# Patient Record
Sex: Female | Born: 1955 | Race: White | Hispanic: No | Marital: Married | Smoking: Never smoker
Health system: Southern US, Community
[De-identification: ages and names within clinical notes are randomized; demographics above are authoritative.]

## PROBLEM LIST (undated history)

## (undated) DIAGNOSIS — B009 Herpesviral infection, unspecified: Secondary | ICD-10-CM

## (undated) DIAGNOSIS — E079 Disorder of thyroid, unspecified: Secondary | ICD-10-CM

## (undated) DIAGNOSIS — C55 Malignant neoplasm of uterus, part unspecified: Secondary | ICD-10-CM

## (undated) DIAGNOSIS — E785 Hyperlipidemia, unspecified: Secondary | ICD-10-CM

## (undated) HISTORY — PX: ABDOMINAL HYSTERECTOMY: SHX81

## (undated) HISTORY — DX: Malignant neoplasm of uterus, part unspecified: C55

## (undated) HISTORY — DX: Herpesviral infection, unspecified: B00.9

## (undated) HISTORY — PX: TONSILLECTOMY AND ADENOIDECTOMY: SUR1326

## (undated) HISTORY — PX: TUBAL LIGATION: SHX77

## (undated) HISTORY — DX: Disorder of thyroid, unspecified: E07.9

## (undated) HISTORY — DX: Hyperlipidemia, unspecified: E78.5

---

## 2011-03-01 ENCOUNTER — Other Ambulatory Visit: Payer: Self-pay | Admitting: Family Medicine

## 2011-03-01 ENCOUNTER — Ambulatory Visit
Admission: RE | Admit: 2011-03-01 | Discharge: 2011-03-01 | Disposition: A | Discharge status: Home / Self Care | Payer: BLUE CROSS/BLUE SHIELD | Source: Ambulatory Visit | Attending: Family Medicine | Admitting: Family Medicine

## 2011-03-01 DIAGNOSIS — Z1231 Encounter for screening mammogram for malignant neoplasm of breast: Secondary | ICD-10-CM

## 2012-01-24 ENCOUNTER — Other Ambulatory Visit: Payer: Self-pay | Admitting: Family Medicine

## 2012-01-24 DIAGNOSIS — Z1231 Encounter for screening mammogram for malignant neoplasm of breast: Secondary | ICD-10-CM

## 2012-03-01 LAB — HM COLONOSCOPY

## 2012-03-06 ENCOUNTER — Ambulatory Visit
Admission: RE | Admit: 2012-03-06 | Discharge: 2012-03-06 | Disposition: A | Discharge status: Home / Self Care | Payer: BLUE CROSS/BLUE SHIELD | Source: Ambulatory Visit | Attending: Family Medicine | Admitting: Family Medicine

## 2012-03-06 DIAGNOSIS — Z1231 Encounter for screening mammogram for malignant neoplasm of breast: Secondary | ICD-10-CM

## 2012-08-02 ENCOUNTER — Ambulatory Visit (INDEPENDENT_AMBULATORY_CARE_PROVIDER_SITE_OTHER): Payer: BLUE CROSS/BLUE SHIELD | Admitting: Sports Medicine

## 2012-08-02 ENCOUNTER — Encounter: Payer: Self-pay | Admitting: Sports Medicine

## 2012-08-02 VITALS — BP 134/71 | HR 74 | Temp 97.0°F | Ht 61.5 in | Wt 150.0 lb

## 2012-08-02 DIAGNOSIS — Z23 Encounter for immunization: Secondary | ICD-10-CM

## 2012-08-02 DIAGNOSIS — H1010 Acute atopic conjunctivitis, unspecified eye: Secondary | ICD-10-CM | POA: Insufficient documentation

## 2012-08-02 DIAGNOSIS — E785 Hyperlipidemia, unspecified: Secondary | ICD-10-CM

## 2012-08-02 DIAGNOSIS — I10 Essential (primary) hypertension: Secondary | ICD-10-CM | POA: Insufficient documentation

## 2012-08-02 DIAGNOSIS — E079 Disorder of thyroid, unspecified: Secondary | ICD-10-CM

## 2012-08-02 DIAGNOSIS — B009 Herpesviral infection, unspecified: Secondary | ICD-10-CM | POA: Insufficient documentation

## 2012-08-02 DIAGNOSIS — H1045 Other chronic allergic conjunctivitis: Secondary | ICD-10-CM

## 2012-08-02 DIAGNOSIS — E039 Hypothyroidism, unspecified: Secondary | ICD-10-CM | POA: Insufficient documentation

## 2012-08-02 DIAGNOSIS — D1800 Hemangioma unspecified site: Secondary | ICD-10-CM | POA: Insufficient documentation

## 2012-08-02 DIAGNOSIS — Z299 Encounter for prophylactic measures, unspecified: Secondary | ICD-10-CM | POA: Insufficient documentation

## 2012-08-02 DIAGNOSIS — K644 Residual hemorrhoidal skin tags: Secondary | ICD-10-CM

## 2012-08-02 MED ORDER — WITCH HAZEL EX PADS: MEDICATED_PAD | CUTANEOUS | Status: DC

## 2012-08-02 MED ORDER — HYDROCORTISONE ACETATE 25 MG RE SUPP: 25.0000 mg | Freq: Two times a day (BID) | RECTAL | Status: AC | PRN

## 2012-08-02 MED ORDER — DOCUSATE SODIUM 100 MG PO CAPS: 100.0000 mg | ORAL_CAPSULE | Freq: Two times a day (BID) | ORAL | Status: AC

## 2012-08-02 MED ORDER — OLOPATADINE HCL 0.1 % OP SOLN: 1.0000 [drp] | Freq: Two times a day (BID) | OPHTHALMIC | Status: DC

## 2012-08-02 NOTE — Assessment & Plan Note (Signed)
Well controlled. Checking CMET. 

## 2012-08-02 NOTE — Assessment & Plan Note (Signed)
It is recommended she continue her fexofenadine, as well as start Patanol eyedrops. She will come back to see me on an as-needed basis for this.

## 2012-08-02 NOTE — Progress Notes (Signed)
Patient ID: Debra Brown, female   DOB: 1956-10-28, 56 y.o.   MRN: [id number] Subjective:    CC: Establish care.   HPI:  Hemorrhoids:  Present for several months, itchy, and moderately painful. She does note that she tends to strain and stooling. She notes occasional blood on the toilet paper, but not in the bowl.  Itchy eyes: present typically this time of year, currently using fexofenadine 180 mg which is ineffective. No visual changes.  Hypothyroidism:  Has recently had her dose cut in half from 150 to 75 mcg approximately 3 months ago.  Hypertension:  Well-controlled currently with lisinopril/hydrochlorothiazide.  Skin lesion:  Present for many years, located on the inner right thigh, tends to bleed profusely when cut during shaving.  Preventive care:  Colonoscopy in May of 2013, negative, mammograms are done here, and up-to-date, has not yet had tetanus shot.  Past medical history, Surgical history, Family history, Social history, Allergies, and medications have been entered into the medical record, reviewed, and no changes needed.   Review of Systems: No headache, visual changes, nausea, vomiting, diarrhea, constipation, dizziness, abdominal pain, skin rash, fevers, chills, night sweats, weight loss, chest pain, body aches, joint swelling, muscle aches, or shortness of breath.   Objective:    General: Well Developed, well nourished, and in no acute distress.  Neuro: Alert and oriented x3, extra-ocular muscles intact.  HEENT: Normocephalic, atraumatic, pupils equal round reactive to light, neck supple, no masses, no lymphadenopathy, thyroid nonpalpable.  Skin: small, 0.5 cm hemangioma present on the medial right thigh..  Cardiac: Regular rate and rhythm, no murmurs rubs or gallops.  Respiratory: Clear to auscultation bilaterally. Not using accessory muscles, speaking in full sentences.  Abdominal: Soft, nontender, nondistended, positive bowel sounds, no masses, no organomegaly.    Musculoskeletal: Shoulder, elbow, wrist, hip, knee, ankle stable, and with full range of motion.    Impression and Recommendations:

## 2012-08-02 NOTE — Assessment & Plan Note (Signed)
I will excise this at her next visit.

## 2012-08-02 NOTE — Assessment & Plan Note (Signed)
Anusol suppositories, witch hazel pads, Colace. She can come back to see me on an as-needed basis for this, if acute flair develops, this can be surgically excised in the office if caught within the first 48 hours.

## 2012-08-02 NOTE — Assessment & Plan Note (Signed)
With her recent dose adjustment, we will go ahead and recheck her TSH. He does note some temperature change intolerance, and is curious as to whether this is due to perimenopausal syndrome versus thyroid disease. We will correct her thyroid disease, and if this persists, I can treat her for a vasomotor instability. She did desire to try more natural method first, I recommended black cohosh, I did explain the limitations in studied effectiveness.

## 2012-08-06 LAB — LIPID PANEL
Cholesterol: 189 mg/dL (ref 0–200)
HDL: 60 mg/dL (ref >39)
LDL Cholesterol: 114 mg/dL — ABNORMAL HIGH (ref 0–99)
Total CHOL/HDL Ratio: 3.2 ratio
Triglycerides: 75 mg/dL (ref <150)
VLDL: 15 mg/dL (ref 0–40)

## 2012-08-06 LAB — COMPREHENSIVE METABOLIC PANEL WITH GFR
AST: 16 U/L (ref 0–37)
BUN: 16 mg/dL (ref 6–23)
CO2: 28 meq/L (ref 19–32)
Calcium: 9.4 mg/dL (ref 8.4–10.5)
Chloride: 103 meq/L (ref 96–112)
Creat: 0.82 mg/dL (ref 0.50–1.10)
Glucose, Bld: 84 mg/dL (ref 70–99)

## 2012-08-06 LAB — TSH: TSH: 7.09 u[IU]/mL — ABNORMAL HIGH (ref 0.350–4.500)

## 2012-08-06 LAB — COMPREHENSIVE METABOLIC PANEL
ALT: 13 U/L (ref 0–35)
Albumin: 4.1 g/dL (ref 3.5–5.2)
Alkaline Phosphatase: 73 U/L (ref 39–117)
Potassium: 3.9 mEq/L (ref 3.5–5.3)
Sodium: 140 mEq/L (ref 135–145)
Total Bilirubin: 0.4 mg/dL (ref 0.3–1.2)
Total Protein: 7.2 g/dL (ref 6.0–8.3)

## 2012-08-07 ENCOUNTER — Encounter: Payer: Self-pay | Admitting: Sports Medicine

## 2012-08-07 ENCOUNTER — Telehealth: Payer: Self-pay | Admitting: *Deleted

## 2012-08-07 MED ORDER — LEVOTHYROXINE SODIUM 88 MCG PO TABS: 88.0000 ug | ORAL_TABLET | Freq: Every day | ORAL | Status: DC

## 2012-08-07 NOTE — Telephone Encounter (Signed)
Message copied by Renea Ee on Tue Aug 07, 2012  9:16 AM ------      Message from: Monica Becton      Created: Tue Aug 07, 2012  9:07 AM       Please call and have her switch to the 88 mcg levothyroxine.  Her TSH was still too high (undertreated, she was taking )

## 2012-08-07 NOTE — Addendum Note (Signed)
Addended by: Monica Becton on: 08/07/2012 09:05 AM   Modules accepted: Orders

## 2012-08-07 NOTE — Telephone Encounter (Signed)
Pt informed

## 2012-08-09 ENCOUNTER — Ambulatory Visit (INDEPENDENT_AMBULATORY_CARE_PROVIDER_SITE_OTHER): Payer: BLUE CROSS/BLUE SHIELD | Admitting: Sports Medicine

## 2012-08-09 ENCOUNTER — Encounter: Payer: Self-pay | Admitting: Sports Medicine

## 2012-08-09 ENCOUNTER — Encounter: Payer: Self-pay | Admitting: *Deleted

## 2012-08-09 VITALS — BP 154/78 | HR 60 | Wt 152.0 lb

## 2012-08-09 DIAGNOSIS — D1800 Hemangioma unspecified site: Secondary | ICD-10-CM

## 2012-08-09 NOTE — Assessment & Plan Note (Signed)
Excision performed and sent off for pathology. She'll come back to see me in an as needed basis. Aftercare advised.

## 2012-08-09 NOTE — Progress Notes (Signed)
Subjective:    CC: Removal of right thigh hemangioma  HPI: Debra Brown comes in for excision of a lesion on her right inner thigh that appeared to be hemangioma at previous visits. It bleeds profusely ever she shaves her thighs. She desires to have this removed. It is been there for years, has been fairly stable in terms of size.  Past medical history, Surgical history, Family history, Social history, Allergies, and medications have been entered into the medical record, reviewed, and no changes needed.   Review of Systems: No fevers, chills, night sweats, weight loss, chest pain, or shortness of breath.   Objective:    General: Well Developed, well nourished, and in no acute distress.   Procedure:  Excision of 1 cm right inner thigh hemangioma  Risks, benefits, and alternatives explained and consent obtained. Time out conducted. Surface prepped with alcohol. 5cc lidocaine with epinephine infiltrated in a field block. Adequate anesthesia ensured. Area prepped and draped in a sterile fashion. Excision performed with: 4 mm punch biopsy. Specimen sent off to pathology. 5-0 Vicryl used in the subcuticular suture to approximate the edges of the wound. Steri-Strips placed on top. Hemostasis achieved. Pt stable. Impression and Recommendations:

## 2019-02-28 DIAGNOSIS — C541 Malignant neoplasm of endometrium: Secondary | ICD-10-CM | POA: Insufficient documentation

## 2019-03-10 DIAGNOSIS — I447 Left bundle-branch block, unspecified: Secondary | ICD-10-CM | POA: Insufficient documentation

## 2019-12-30 ENCOUNTER — Telehealth: Payer: Self-pay | Admitting: General Practice

## 2019-12-30 NOTE — Telephone Encounter (Signed)
That is fine 

## 2019-12-30 NOTE — Telephone Encounter (Signed)
Patient wanted to establish with you, daughter, Sheralyn Boatman, is your patient. Please advise.

## 2020-01-01 NOTE — Telephone Encounter (Signed)
Left voicemail for patient to call us back for an appointment and with information below.

## 2020-01-08 ENCOUNTER — Encounter: Payer: Self-pay | Admitting: Physician Assistant

## 2020-01-08 ENCOUNTER — Ambulatory Visit (INDEPENDENT_AMBULATORY_CARE_PROVIDER_SITE_OTHER): Payer: Medicare HMO | Admitting: Physician Assistant

## 2020-01-08 ENCOUNTER — Other Ambulatory Visit: Payer: Self-pay

## 2020-01-08 VITALS — BP 130/84 | HR 66 | Ht 61.5 in | Wt 147.0 lb

## 2020-01-08 DIAGNOSIS — Z780 Asymptomatic menopausal state: Secondary | ICD-10-CM | POA: Diagnosis not present

## 2020-01-08 DIAGNOSIS — I10 Essential (primary) hypertension: Secondary | ICD-10-CM | POA: Diagnosis not present

## 2020-01-08 DIAGNOSIS — Z8639 Personal history of other endocrine, nutritional and metabolic disease: Secondary | ICD-10-CM

## 2020-01-08 DIAGNOSIS — E890 Postprocedural hypothyroidism: Secondary | ICD-10-CM

## 2020-01-08 DIAGNOSIS — Z13820 Encounter for screening for osteoporosis: Secondary | ICD-10-CM

## 2020-01-08 DIAGNOSIS — Z8542 Personal history of malignant neoplasm of other parts of uterus: Secondary | ICD-10-CM

## 2020-01-08 DIAGNOSIS — Z1159 Encounter for screening for other viral diseases: Secondary | ICD-10-CM

## 2020-01-08 DIAGNOSIS — Z23 Encounter for immunization: Secondary | ICD-10-CM

## 2020-01-08 DIAGNOSIS — Z13220 Encounter for screening for lipoid disorders: Secondary | ICD-10-CM

## 2020-01-08 DIAGNOSIS — Z131 Encounter for screening for diabetes mellitus: Secondary | ICD-10-CM

## 2020-01-08 MED ORDER — LISINOPRIL-HYDROCHLOROTHIAZIDE 20-25 MG PO TABS: 1.0000 | ORAL_TABLET | Freq: Every day | ORAL | 1 refills | Status: DC

## 2020-01-08 MED ORDER — LEVOTHYROXINE SODIUM 125 MCG PO TABS: 125.0000 ug | ORAL_TABLET | Freq: Every day | ORAL | 0 refills | Status: DC

## 2020-01-08 MED ORDER — AMBULATORY NON FORMULARY MEDICATION: 0 refills | Status: DC

## 2020-01-08 NOTE — Patient Instructions (Addendum)
..  COVID-19 Vaccine Information can be found at: [email or web address].uk For questions related to vaccine distribution or appointments, please email [email or web address] or call [phone].   Get labs today.  Get Covid vaccine.  GET shingles vaccine.  Follow up in 6 month on BP.

## 2020-01-08 NOTE — Progress Notes (Signed)
New Patient Office Visit  Subjective:  Patient ID: Debra Brown, female    DOB: 06-Jan-1956  Age: 64 y.o. MRN: [id number]  CC:  Chief Complaint  Patient presents with  . Establish Care    HPI Debra Brown presents to establish care.   No ongoing concerns today. Just wants to "stay up to date on her health".   She is scheduled for endocrinologist on march 1st. Her TSH continues to be elevated despite being on medication. Per pt last TSH was above 10. No adjustment was made to medication. Only referral made.    Past Medical History:  Diagnosis Date  . HSV-1 (herpes simplex virus 1) infection   . Hyperlipidemia   . Thyroid disease   . Uterine cancer [facility])     Past Surgical History:  Procedure Laterality Date  . ABDOMINAL HYSTERECTOMY    . CESAREAN SECTION    . TONSILLECTOMY AND ADENOIDECTOMY    . TUBAL LIGATION      Family History  Problem Relation Age of Onset  . Hyperlipidemia Mother   . Hypertension Mother   . Heart attack Mother   . Diabetes Mother   . Heart attack Father   . Heart attack Sister   . Diabetes Sister   . Heart attack Sister   . Diabetes Sister   . CAD Sister   . Stroke Paternal Aunt     Social History   Socioeconomic History  . Marital status: Married    Spouse name: Not on file  . Number of children: Not on file  . Years of education: Not on file  . Highest education level: Not on file  Occupational History  . Not on file  Tobacco Use  . Smoking status: Never Smoker  . Smokeless tobacco: Never Used  Substance and Sexual Activity  . Alcohol use: Yes    Alcohol/week: 2.0 standard drinks    Types: 2 Glasses of wine per week  . Drug use: No  . Sexual activity: Yes    Partners: Male  Other Topics Concern  . Not on file  Social History Narrative  . Not on file   Social Determinants of Health   Financial Resource Strain:   . Difficulty of Paying Living Expenses: Not on file  Food Insecurity:   . Worried About Sales executive in the Last Year: Not on file  . Ran Out of Food in the Last Year: Not on file  Transportation Needs:   . Lack of Transportation (Medical): Not on file  . Lack of Transportation (Non-Medical): Not on file  Physical Activity:   . Days of Exercise per Week: Not on file  . Minutes of Exercise per Session: Not on file  Stress:   . Feeling of Stress : Not on file  Social Connections:   . Frequency of Communication with Friends and Family: Not on file  . Frequency of Social Gatherings with Friends and Family: Not on file  . Attends Religious Services: Not on file  . Active Member of Clubs or Organizations: Not on file  . Attends Archivist Meetings: Not on file  . Marital Status: Not on file  Intimate Partner Violence:   . Fear of Current or Ex-Partner: Not on file  . Emotionally Abused: Not on file  . Physically Abused: Not on file  . Sexually Abused: Not on file    ROS Review of Systems  All other systems reviewed and are negative.   Objective:  Today's Vitals: BP 130/84   Pulse 66   Ht 5' 1.5" (1.562 m)   Wt 147 lb (66.7 kg)   SpO2 97%   BMI 27.33 kg/m   Physical Exam Vitals reviewed.  Constitutional:      Appearance: Normal appearance.  HENT:     Head: Normocephalic.  Neck:     Vascular: No carotid bruit.  Cardiovascular:     Rate and Rhythm: Normal rate and regular rhythm.     Pulses: Normal pulses.     Heart sounds: No murmur.  Pulmonary:     Effort: Pulmonary effort is normal.     Breath sounds: Normal breath sounds.  Lymphadenopathy:     Cervical: No cervical adenopathy.  Neurological:     General: No focal deficit present.     Mental Status: She is alert and oriented to person, place, and time.  Psychiatric:        Mood and Affect: Mood normal.        Behavior: Behavior normal.     Assessment & Plan:  .Marland KitchenJase was seen today for establish care.  Diagnoses and all orders for this visit:  Essential hypertension -      lisinopril-hydrochlorothiazide (ZESTORETIC) 20-25 MG tablet; Take 1 tablet by mouth daily.  Postablative hypothyroidism -     levothyroxine (SYNTHROID) 125 MCG tablet; Take 1 tablet (125 mcg total) by mouth daily.  Osteoporosis screening -     DG Bone Density -     VITAMIN D 25 Hydroxy (Vit-D Deficiency, Fractures)  Post-menopausal -     DG Bone Density -     VITAMIN D 25 Hydroxy (Vit-D Deficiency, Fractures)  Screening for lipid disorders -     Lipid Panel w/reflex Direct LDL  Screening for diabetes mellitus -     COMPLETE METABOLIC PANEL WITH GFR  Encounter for hepatitis C screening test for low risk patient -     Hepatitis C Antibody  History of uterine cancer  History of Graves' disease  Need for shingles vaccine -     AMBULATORY NON FORMULARY MEDICATION; shingrx 2 doses to prevent shingles.  reviewed labs in careeverywhere.  TSH on 1/25 was 10.4. went ahead and bumped up levothyroxine. Keep follow up with endocrinology.   Refilled BP medication. BP to goal today.   Pt does need shingles vaccine. Printed rx. Ordered bone density. Will abstract mammogram from [facility] records. Fasting labs ordered.   Discussed covid vaccine. Information given. No other vaccines within 14 days of covid vaccine.    Follow-up: Return in about 6 months (around 07/07/2020) for BP follow up. Iran Planas, PA-C

## 2020-01-10 ENCOUNTER — Encounter: Payer: Self-pay | Admitting: Physician Assistant

## 2020-01-10 DIAGNOSIS — Z780 Asymptomatic menopausal state: Secondary | ICD-10-CM | POA: Insufficient documentation

## 2020-01-10 DIAGNOSIS — Z8542 Personal history of malignant neoplasm of other parts of uterus: Secondary | ICD-10-CM | POA: Insufficient documentation

## 2020-01-22 ENCOUNTER — Encounter: Payer: Self-pay | Admitting: Physician Assistant

## 2020-01-22 ENCOUNTER — Other Ambulatory Visit: Payer: Self-pay

## 2020-01-22 ENCOUNTER — Ambulatory Visit (INDEPENDENT_AMBULATORY_CARE_PROVIDER_SITE_OTHER): Payer: Medicare HMO

## 2020-01-22 DIAGNOSIS — Z780 Asymptomatic menopausal state: Secondary | ICD-10-CM

## 2020-01-22 DIAGNOSIS — M8580 Other specified disorders of bone density and structure, unspecified site: Secondary | ICD-10-CM | POA: Insufficient documentation

## 2020-01-22 DIAGNOSIS — Z13820 Encounter for screening for osteoporosis: Secondary | ICD-10-CM | POA: Diagnosis not present

## 2020-01-22 NOTE — Progress Notes (Signed)
Debra Brown,   Your bone density shows osteopenia low bone mass. Follow up in 2 years. Make sure taking vitamin D at least 1000 units daily and 1300mg  or 4 servings of dairy daily.

## 2020-04-13 ENCOUNTER — Encounter: Payer: Self-pay | Admitting: Family Medicine

## 2020-04-13 ENCOUNTER — Telehealth (INDEPENDENT_AMBULATORY_CARE_PROVIDER_SITE_OTHER): Payer: Medicare HMO | Admitting: Family Medicine

## 2020-04-13 DIAGNOSIS — J329 Chronic sinusitis, unspecified: Secondary | ICD-10-CM | POA: Diagnosis not present

## 2020-04-13 DIAGNOSIS — J40 Bronchitis, not specified as acute or chronic: Secondary | ICD-10-CM | POA: Diagnosis not present

## 2020-04-13 MED ORDER — AZITHROMYCIN 250 MG PO TABS: ORAL_TABLET | ORAL | 0 refills | Status: DC

## 2020-04-13 MED ORDER — PREDNISONE 20 MG PO TABS: 20.0000 mg | ORAL_TABLET | Freq: Two times a day (BID) | ORAL | 0 refills | Status: AC

## 2020-04-13 NOTE — Progress Notes (Signed)
Debra Brown - 64 y.o. female MRN [id number]  Date of birth: 06-15-1956   This visit type was conducted due to national recommendations for restrictions regarding the COVID-19 Pandemic (e.g. social distancing).  This format is felt to be most appropriate for this patient at this time.  All issues noted in this document were discussed and addressed.  No physical exam was performed (except for noted visual exam findings with Video Visits).  I discussed the limitations of evaluation and management by telemedicine and the availability of in person appointments. The patient expressed understanding and agreed to proceed.  I connected with@ on 04/13/20 at  9:10 AM EDT by a video enabled telemedicine application and verified that I am speaking with the correct person using two identifiers.  Present at visit: Luetta Nutting, DO Norva Riffle   Patient Location: Home [address] [place] [address]   Provider location:   [facility]  Chief Complaint  Patient presents with  . URI    HPI  Debra Brown is a 64 y.o. female who presents via audio/video conferencing for a telehealth visit today.  She reports 3 week history of nasal congestion with post nasal drainage, sinus pressure.  She developed some chest congestion as well over the past few days and mucus has become thicker with yellow coloration.  She denies fever, chills, shortness of breath or wheezing.  Hasn't really had improvement with allergy medication, mucinex or coricidin.     ROS:  A comprehensive ROS was completed and negative except as noted per HPI  Past Medical History:  Diagnosis Date  . HSV-1 (herpes simplex virus 1) infection   . Hyperlipidemia   . Thyroid disease   . Uterine cancer [facility])     Past Surgical History:  Procedure Laterality Date  . ABDOMINAL HYSTERECTOMY    . CESAREAN SECTION    . TONSILLECTOMY AND ADENOIDECTOMY    . TUBAL LIGATION      Family History  Problem Relation Age of Onset  .  Hyperlipidemia Mother   . Hypertension Mother   . Heart attack Mother   . Diabetes Mother   . Heart attack Father   . Heart attack Sister   . Diabetes Sister   . Heart attack Sister   . Diabetes Sister   . CAD Sister   . Stroke Paternal Aunt     Social History   Socioeconomic History  . Marital status: Married    Spouse name: Not on file  . Number of children: Not on file  . Years of education: Not on file  . Highest education level: Not on file  Occupational History  . Not on file  Tobacco Use  . Smoking status: Never Smoker  . Smokeless tobacco: Never Used  Substance and Sexual Activity  . Alcohol use: Yes    Alcohol/week: 2.0 standard drinks    Types: 2 Glasses of wine per week  . Drug use: No  . Sexual activity: Yes    Partners: Male  Other Topics Concern  . Not on file  Social History Narrative  . Not on file   Social Determinants of Health   Financial Resource Strain:   . Difficulty of Paying Living Expenses:   Food Insecurity:   . Worried About Charity fundraiser in the Last Year:   . Arboriculturist in the Last Year:   Transportation Needs:   . Film/video editor (Medical):   [name] Lack of Transportation (Non-Medical):   Physical  Activity:   . Days of Exercise per Week:   . Minutes of Exercise per Session:   Stress:   . Feeling of Stress :   Social Connections:   . Frequency of Communication with Friends and Family:   . Frequency of Social Gatherings with Friends and Family:   . Attends Religious Services:   . Active Member of Clubs or Organizations:   . Attends Archivist Meetings:   [name] Marital Status:   Intimate Partner Violence:   . Fear of Current or Ex-Partner:   . Emotionally Abused:   [name] Physically Abused:   . Sexually Abused:      Current Outpatient Medications:  .  AMBULATORY NON FORMULARY MEDICATION, shingrx 2 doses to prevent shingles., Disp: 2 application, Rfl: 0 .  Ascorbic Acid (VITAMIN C) 1000 MG tablet, Take 1,000  mg by mouth daily., Disp: , Rfl:  .  Biotin 10000 MCG TABS, Take by mouth daily., Disp: , Rfl:  .  CALCIUM PO, Take 600 mg by mouth daily., Disp: , Rfl:  .  cholecalciferol (VITAMIN D3) 25 MCG (1000 UNIT) tablet, Take 2,000 Units by mouth daily., Disp: , Rfl:  .  Cyanocobalamin (B-12) 2500 MCG TABS, Take by mouth daily., Disp: , Rfl:  .  fexofenadine (ALLEGRA) 180 MG tablet, Take 180 mg by mouth daily., Disp: , Rfl:  .  levothyroxine (SYNTHROID) 125 MCG tablet, Take 1 tablet (125 mcg total) by mouth daily., Disp: 30 tablet, Rfl: 0 .  lisinopril-hydrochlorothiazide (ZESTORETIC) 20-25 MG tablet, Take 1 tablet by mouth daily., Disp: 90 tablet, Rfl: 1 .  Magnesium 250 MG TABS, Take by mouth daily., Disp: , Rfl:  .  Multiple Vitamins-Minerals (CENTRUM SILVER PO), Take by mouth daily., Disp: , Rfl:  .  Multiple Vitamins-Minerals (ZINC PO), Take 50 mg by mouth daily., Disp: , Rfl:  .  Probiotic Product (PROBIOTIC-10 PO), Take by mouth daily., Disp: , Rfl:  .  Propylene Glycol (SYSTANE BALANCE OP), Apply to eye., Disp: , Rfl:  .  TURMERIC CURCUMIN PO, Take by mouth daily. 450/10 mg, Disp: , Rfl:  .  azithromycin (ZITHROMAX) 250 MG tablet, Take 2 tabs on day 1 then 1 tab on days 2-5., Disp: 6 tablet, Rfl: 0 .  predniSONE (DELTASONE) 20 MG tablet, Take 1 tablet (20 mg total) by mouth 2 (two) times daily with a meal for 5 days., Disp: 10 tablet, Rfl: 0  EXAM:  VITALS per patient if applicable: Temp [id number] (37.1 C) (Oral)   Wt 143 lb 8 oz (65.1 kg)   BMI 26.68 kg/m   GENERAL: alert, oriented, appears well and in no acute distress  HEENT: atraumatic, conjunttiva clear, no obvious abnormalities on inspection of external nose and ears  NECK: normal movements of the head and neck  LUNGS: on inspection no signs of respiratory distress, breathing rate appears normal, no obvious gross SOB, gasping or wheezing  CV: no obvious cyanosis  MS: moves all visible extremities without noticeable  abnormality  PSYCH/NEURO: pleasant and cooperative, no obvious depression or anxiety, speech and thought processing grossly intact  ASSESSMENT AND PLAN:  Discussed the following assessment and plan:  Sinobronchitis Continue supportive care with increased fluid intake and rest.  She may continue mucinex but stressed increased fluid for this to be effective.  Since symptoms have been prolonged will go ahead and start course of azithromycin and prednisone.  I let her know that she should contact us if symptoms continue to not improve or she  develops new or worsening symptoms.    30 minutes spent including pre visit preparation, review of prior notes and labs, encounter with patient via video visit and same day documentation.  I discussed the assessment and treatment plan with the patient. The patient was provided an opportunity to ask questions and all were answered. The patient agreed with the plan and demonstrated an understanding of the instructions.   The patient was advised to call back or seek an in-person evaluation if the symptoms worsen or if the condition fails to improve as anticipated.    Luetta Nutting, DO

## 2020-04-13 NOTE — Progress Notes (Signed)
Taking Vitamin C, Zinc, and B12.   Started like a cold. Sinus drainage into her chest. Phlegm in throat feels like it could choke her. Sx x 3 weeks.

## 2020-04-13 NOTE — Assessment & Plan Note (Signed)
Continue supportive care with increased fluid intake and rest.  She may continue mucinex but stressed increased fluid for this to be effective.  Since symptoms have been prolonged will go ahead and start course of azithromycin and prednisone.  I let her know that she should contact us if symptoms continue to not improve or she develops new or worsening symptoms.

## 2020-06-15 ENCOUNTER — Other Ambulatory Visit: Payer: Self-pay | Admitting: Physician Assistant

## 2020-06-15 DIAGNOSIS — Z1231 Encounter for screening mammogram for malignant neoplasm of breast: Secondary | ICD-10-CM

## 2020-06-24 ENCOUNTER — Ambulatory Visit: Payer: Medicare HMO

## 2020-06-24 ENCOUNTER — Other Ambulatory Visit: Payer: Self-pay

## 2020-07-06 ENCOUNTER — Encounter: Payer: Self-pay | Admitting: Physician Assistant

## 2020-07-06 ENCOUNTER — Ambulatory Visit (INDEPENDENT_AMBULATORY_CARE_PROVIDER_SITE_OTHER): Payer: Medicare HMO | Admitting: Physician Assistant

## 2020-07-06 VITALS — BP 129/56 | HR 63 | Ht 61.5 in | Wt 145.0 lb

## 2020-07-06 DIAGNOSIS — I10 Essential (primary) hypertension: Secondary | ICD-10-CM

## 2020-07-06 DIAGNOSIS — Z1159 Encounter for screening for other viral diseases: Secondary | ICD-10-CM

## 2020-07-06 DIAGNOSIS — Z0000 Encounter for general adult medical examination without abnormal findings: Secondary | ICD-10-CM | POA: Diagnosis not present

## 2020-07-06 DIAGNOSIS — E039 Hypothyroidism, unspecified: Secondary | ICD-10-CM

## 2020-07-06 DIAGNOSIS — Z131 Encounter for screening for diabetes mellitus: Secondary | ICD-10-CM

## 2020-07-06 DIAGNOSIS — L821 Other seborrheic keratosis: Secondary | ICD-10-CM

## 2020-07-06 DIAGNOSIS — Z13220 Encounter for screening for lipoid disorders: Secondary | ICD-10-CM

## 2020-07-06 MED ORDER — LISINOPRIL-HYDROCHLOROTHIAZIDE 20-25 MG PO TABS: 1.0000 | ORAL_TABLET | Freq: Every day | ORAL | 1 refills | Status: DC

## 2020-07-06 NOTE — Progress Notes (Signed)
Subjective:     Debra Brown is a 64 y.o. female and is here for a comprehensive physical exam. The patient reports no problems.  Social History   Socioeconomic History  . Marital status: Married    Spouse name: Not on file  . Number of children: Not on file  . Years of education: Not on file  . Highest education level: Not on file  Occupational History  . Not on file  Tobacco Use  . Smoking status: Never Smoker  . Smokeless tobacco: Never Used  Substance and Sexual Activity  . Alcohol use: Yes    Alcohol/week: 2.0 standard drinks    Types: 2 Glasses of wine per week  . Drug use: No  . Sexual activity: Yes    Partners: Male  Other Topics Concern  . Not on file  Social History Narrative  . Not on file   Social Determinants of Health   Financial Resource Strain:   . Difficulty of Paying Living Expenses:   Food Insecurity:   . Worried About Charity fundraiser in the Last Year:   . Arboriculturist in the Last Year:   Transportation Needs:   . Film/video editor (Medical):   [name] Lack of Transportation (Non-Medical):   Physical Activity:   . Days of Exercise per Week:   . Minutes of Exercise per Session:   Stress:   . Feeling of Stress :   Social Connections:   . Frequency of Communication with Friends and Family:   . Frequency of Social Gatherings with Friends and Family:   . Attends Religious Services:   . Active Member of Clubs or Organizations:   . Attends Archivist Meetings:   [name] Marital Status:   Intimate Partner Violence:   . Fear of Current or Ex-Partner:   . Emotionally Abused:   [name] Physically Abused:   . Sexually Abused:    Health Maintenance  Topic Date Due  . Hepatitis C Screening  Never done  . INFLUENZA VACCINE  06/21/2020  . HIV Screening  01/07/2021 (Originally 07/09/1971)  . PNA vac Low Risk Adult (2 of 2 - PPSV23) 07/14/2020  . COLONOSCOPY  11/21/2020  . MAMMOGRAM  04/21/2021  . PAP SMEAR-Modifier  04/21/2022  . TETANUS/TDAP   07/26/2027  . DEXA SCAN  Completed  . COVID-19 Vaccine  Completed    The following portions of the patient's history were reviewed and updated as appropriate: allergies, current medications, past family history, past medical history, past social history, past surgical history and problem list.  Review of Systems A comprehensive review of systems was negative.   Objective:    BP (!) 129/56   Pulse 63   Ht 5' 1.5" (1.562 m)   Wt 145 lb (65.8 kg)   SpO2 100%   BMI 26.95 kg/m  General appearance: alert, cooperative and appears stated age Head: Normocephalic, without obvious abnormality, atraumatic Eyes: conjunctivae/corneas clear. PERRL, EOM's intact. Fundi benign. Ears: normal TM's and external ear canals both ears Nose: Nares normal. Septum midline. Mucosa normal. No drainage or sinus tenderness. Throat: lips, mucosa, and tongue normal; teeth and gums normal Neck: no adenopathy, no carotid bruit, no JVD, supple, symmetrical, trachea midline and thyroid not enlarged, symmetric, no tenderness/mass/nodules Back: symmetric, no curvature. ROM normal. No CVA tenderness. Lungs: clear to auscultation bilaterally Heart: regular rate and rhythm, S1, S2 normal, no murmur, click, rub or gallop Abdomen: soft, non-tender; bowel sounds normal; no masses,  no organomegaly Extremities:  extremities normal, atraumatic, no cyanosis or edema Pulses: 2+ and symmetric Skin: Skin color, texture, turgor normal. No rashes or lesions scatted SK on legs.  Lymph nodes: Cervical, supraclavicular, and axillary nodes normal. Neurologic: Grossly normal    .. Depression screen [facility] 2/9 07/06/2020 01/08/2020  Decreased Interest 0 0  Down, Depressed, Hopeless 0 0  PHQ - 2 Score 0 0  Altered sleeping - 0  Tired, decreased energy - 0  Change in appetite - 0  Feeling bad or failure about yourself  - 0  Trouble concentrating - 0  Moving slowly or fidgety/restless - 0  Suicidal thoughts - 0  PHQ-9 Score - 0   Difficult doing work/chores - Not difficult at all    Assessment:    Healthy female exam.      Plan:    .Marland KitchenZohra was seen today for annual exam.  Diagnoses and all orders for this visit:  Preventative health care  Essential hypertension -     lisinopril-hydrochlorothiazide (ZESTORETIC) 20-25 MG tablet; Take 1 tablet by mouth daily. -     COMPLETE METABOLIC PANEL WITH GFR  Routine physical examination -     TSH -     Lipid Panel w/reflex Direct LDL -     COMPLETE METABOLIC PANEL WITH GFR -     Hepatitis C Antibody -     VITAMIN D 25 Hydroxy (Vit-D Deficiency, Fractures)  Encounter for hepatitis C screening test for low risk patient -     Hepatitis C Antibody  Screening for diabetes mellitus -     COMPLETE METABOLIC PANEL WITH GFR  Screening for lipid disorders -     Lipid Panel w/reflex Direct LDL  Hypothyroidism, unspecified type -     TSH   .[name] Discussed 150 minutes of exercise a week.  Encouraged vitamin D 1000 units and Calcium 1300mg  or 4 servings of dairy a day.  Fasting labs ordered.  Mammogram UTD and scheduled for sept 2021. Bone density UTD.  Colonoscopy UTD. Vaccines UTD except flu shot but we do not have high dose at this time.   TSH recheck to get refills.   Discuss SK and benign. HO given.   Follow up in 6 month for routine med check.  See After Visit Summary for Counseling Recommendations

## 2020-07-06 NOTE — Patient Instructions (Addendum)
Seborrheic Keratosis A seborrheic keratosis is a common, noncancerous (benign) skin growth. These growths are velvety, waxy, rough, tan, brown, or black spots that appear on the skin. These skin growths can be flat or raised, and scaly. What are the causes? The cause of this condition is not known. What increases the risk? You are more likely to develop this condition if you:  Have a family history of seborrheic keratosis.  Are 50 or older.  Are pregnant.  Have had estrogen replacement therapy. What are the signs or symptoms? Symptoms of this condition include growths on the face, chest, shoulders, back, or other areas. These growths:  Are usually painless, but may become irritated and itchy.  Can be yellow, brown, black, or other colors.  Are slightly raised or have a flat surface.  Are sometimes rough or wart-like in texture.  Are often velvety or waxy on the surface.  Are round or oval-shaped.  Often occur in groups, but may occur as a single growth. How is this diagnosed? This condition is diagnosed with a medical history and physical exam.  A sample of the growth may be tested (skin biopsy).  You may need to see a skin specialist (dermatologist). How is this treated? Treatment is not usually needed for this condition, unless the growths are irritated or bleed often.  You may also choose to have the growths removed if you do not like their appearance. ? Most commonly, these growths are treated with a procedure in which liquid nitrogen is applied to "freeze" off the growth (cryosurgery). ? They may also be burned off with electricity (electrocautery) or removed by scraping (curettage). Follow these instructions at home:  Watch your growth for any changes.  Keep all follow-up visits as told by your health care provider. This is important.  Do not scratch or pick at the growth or growths. This can cause them to become irritated or infected. Contact a health care  provider if:  You suddenly have many new growths.  Your growth bleeds, itches, or hurts.  Your growth suddenly becomes larger or changes color. Summary  A seborrheic keratosis is a common, noncancerous (benign) skin growth.  Treatment is not usually needed for this condition, unless the growths are irritated or bleed often.  Watch your growth for any changes.  Contact a health care provider if you suddenly have many new growths or your growth suddenly becomes larger or changes color.  Keep all follow-up visits as told by your health care provider. This is important. This information is not intended to replace advice given to you by your health care provider. Make sure you discuss any questions you have with your health care provider. Document Revised: 03/22/2018 Document Reviewed: 03/22/2018 Elsevier Patient Education  [place] Maintenance After Age 84 After age 65, you are at a higher risk for certain long-term diseases and infections as well as injuries from falls. Falls are a major cause of broken bones and head injuries in people who are older than age 67. Getting regular preventive care can help to keep you healthy and well. Preventive care includes getting regular testing and making lifestyle changes as recommended by your health care provider. Talk with your health care provider about:  Which screenings and tests you should have. A screening is a test that checks for a disease when you have no symptoms.  A diet and exercise plan that is right for you. What should I know about screenings and tests to prevent falls?  Screening and testing are the best ways to find a health problem early. Early diagnosis and treatment give you the best chance of managing medical conditions that are common after age 54. Certain conditions and lifestyle choices may make you more likely to have a fall. Your health care provider may recommend:  Regular vision checks. Poor vision and  conditions such as cataracts can make you more likely to have a fall. If you wear glasses, make sure to get your prescription updated if your vision changes.  Medicine review. Work with your health care provider to regularly review all of the medicines you are taking, including over-the-counter medicines. Ask your health care provider about any side effects that may make you more likely to have a fall. Tell your health care provider if any medicines that you take make you feel dizzy or sleepy.  Osteoporosis screening. Osteoporosis is a condition that causes the bones to get weaker. This can make the bones weak and cause them to break more easily.  Blood pressure screening. Blood pressure changes and medicines to control blood pressure can make you feel dizzy.  Strength and balance checks. Your health care provider may recommend certain tests to check your strength and balance while standing, walking, or changing positions.  Foot health exam. Foot pain and numbness, as well as not wearing proper footwear, can make you more likely to have a fall.  Depression screening. You may be more likely to have a fall if you have a fear of falling, feel emotionally low, or feel unable to do activities that you used to do.  Alcohol use screening. Using too much alcohol can affect your balance and may make you more likely to have a fall. What actions can I take to lower my risk of falls? General instructions  Talk with your health care provider about your risks for falling. Tell your health care provider if: ? You fall. Be sure to tell your health care provider about all falls, even ones that seem minor. ? You feel dizzy, sleepy, or off-balance.  Take over-the-counter and prescription medicines only as told by your health care provider. These include any supplements.  Eat a healthy diet and maintain a healthy weight. A healthy diet includes low-fat dairy products, low-fat (lean) meats, and fiber from whole  grains, beans, and lots of fruits and vegetables. Home safety  Remove any tripping hazards, such as rugs, cords, and clutter.  Install safety equipment such as grab bars in bathrooms and safety rails on stairs.  Keep rooms and walkways well-lit. Activity   Follow a regular exercise program to stay fit. This will help you maintain your balance. Ask your health care provider what types of exercise are appropriate for you.  If you need a cane or walker, use it as recommended by your health care provider.  Wear supportive shoes that have nonskid soles. Lifestyle  Do not drink alcohol if your health care provider tells you not to drink.  If you drink alcohol, limit how much you have: ? 0-1 drink a day for women. ? 0-2 drinks a day for men.  Be aware of how much alcohol is in your drink. In the [place] one drink equals one typical bottle of beer (12 oz), one-half glass of wine (5 oz), or one shot of hard liquor (1 oz).  Do not use any products that contain nicotine or tobacco, such as cigarettes and e-cigarettes. If you need help quitting, ask your health care provider. Summary  Having  a healthy lifestyle and getting preventive care can help to protect your health and wellness after age 49.  Screening and testing are the best way to find a health problem early and help you avoid having a fall. Early diagnosis and treatment give you the best chance for managing medical conditions that are more common for people who are older than age 2.  Falls are a major cause of broken bones and head injuries in people who are older than age 70. Take precautions to prevent a fall at home.  Work with your health care provider to learn what changes you can make to improve your health and wellness and to prevent falls. This information is not intended to replace advice given to you by your health care provider. Make sure you discuss any questions you have with your health care provider. Document Revised:  02/28/2019 Document Reviewed: 09/20/2017 Elsevier Patient Education  2020 [employer].

## 2020-07-07 ENCOUNTER — Other Ambulatory Visit: Payer: Self-pay | Admitting: Physician Assistant

## 2020-07-07 DIAGNOSIS — E7800 Pure hypercholesterolemia, unspecified: Secondary | ICD-10-CM | POA: Insufficient documentation

## 2020-07-07 DIAGNOSIS — E890 Postprocedural hypothyroidism: Secondary | ICD-10-CM

## 2020-07-07 LAB — LIPID PANEL W/REFLEX DIRECT LDL
Cholesterol: 191 mg/dL (ref <200)
HDL: 65 mg/dL (ref >=50)
LDL Cholesterol (Calc): 109 mg/dL (calc) — ABNORMAL HIGH
Non-HDL Cholesterol (Calc): 126 mg/dL (calc) (ref <130)
Total CHOL/HDL Ratio: 2.9 (calc) (ref <5.0)
Triglycerides: 82 mg/dL (ref <150)

## 2020-07-07 LAB — COMPLETE METABOLIC PANEL WITH GFR
AG Ratio: 1.5 (calc) (ref 1.0–2.5)
ALT: 16 U/L (ref 6–29)
AST: 16 U/L (ref 10–35)
Albumin: 4.1 g/dL (ref 3.6–5.1)
Alkaline phosphatase (APISO): 79 U/L (ref 37–153)
BUN: 17 mg/dL (ref 7–25)
CO2: 28 mmol/L (ref 20–32)
Calcium: 9.5 mg/dL (ref 8.6–10.4)
Chloride: 100 mmol/L (ref 98–110)
Creat: 0.75 mg/dL (ref 0.50–0.99)
GFR, Est African American: 97 mL/min/{1.73_m2} (ref >=60)
GFR, Est Non African American: 84 mL/min/{1.73_m2} (ref >=60)
Globulin: 2.8 g/dL (calc) (ref 1.9–3.7)
Glucose, Bld: 96 mg/dL (ref 65–99)
Potassium: 3.8 mmol/L (ref 3.5–5.3)
Sodium: 137 mmol/L (ref 135–146)
Total Bilirubin: 0.5 mg/dL (ref 0.2–1.2)
Total Protein: 6.9 g/dL (ref 6.1–8.1)

## 2020-07-07 LAB — HEPATITIS C ANTIBODY
Hepatitis C Ab: NONREACTIVE
SIGNAL TO CUT-OFF: 0.01 (ref <1.00)

## 2020-07-07 LAB — TSH: TSH: 2.04 mIU/L (ref 0.40–4.50)

## 2020-07-07 LAB — VITAMIN D 25 HYDROXY (VIT D DEFICIENCY, FRACTURES): Vit D, 25-Hydroxy: 48 ng/mL (ref 30–100)

## 2020-07-07 MED ORDER — LEVOTHYROXINE SODIUM 125 MCG PO TABS: 125.0000 ug | ORAL_TABLET | Freq: Every day | ORAL | 3 refills | Status: DC

## 2020-07-07 NOTE — Progress Notes (Signed)
Debra Brown,   Your thyroid levels looks great.  Your vitamin d is normal. Continue to take daily supplement.  Your kidney, liver, glucose all look great.  Your cholesterol looks good. Due to age and high blood pressure your 10 year risk is 6.0 percent. Still under 7.5 which is recommended to treat with statin.  Recheck labs yearly.

## 2020-07-10 DIAGNOSIS — L821 Other seborrheic keratosis: Secondary | ICD-10-CM | POA: Insufficient documentation

## 2020-07-29 ENCOUNTER — Ambulatory Visit (INDEPENDENT_AMBULATORY_CARE_PROVIDER_SITE_OTHER): Payer: Medicare HMO

## 2020-07-29 ENCOUNTER — Other Ambulatory Visit: Payer: Self-pay

## 2020-07-29 DIAGNOSIS — Z1231 Encounter for screening mammogram for malignant neoplasm of breast: Secondary | ICD-10-CM | POA: Diagnosis not present

## 2020-07-30 NOTE — Progress Notes (Signed)
Normal mammogram follow up in 1 year.

## 2021-01-08 ENCOUNTER — Other Ambulatory Visit: Payer: Self-pay | Admitting: Physician Assistant

## 2021-01-08 DIAGNOSIS — I10 Essential (primary) hypertension: Secondary | ICD-10-CM

## 2021-05-03 ENCOUNTER — Ambulatory Visit: Payer: Medicare HMO

## 2021-06-16 ENCOUNTER — Ambulatory Visit (INDEPENDENT_AMBULATORY_CARE_PROVIDER_SITE_OTHER): Payer: Medicare HMO | Admitting: Physician Assistant

## 2021-06-16 DIAGNOSIS — Z1231 Encounter for screening mammogram for malignant neoplasm of breast: Secondary | ICD-10-CM | POA: Diagnosis not present

## 2021-06-16 DIAGNOSIS — Z0000 Encounter for general adult medical examination without abnormal findings: Secondary | ICD-10-CM

## 2021-06-16 NOTE — Patient Instructions (Addendum)
[place] Maintenance Summary and Written Plan of Care  [name] ,  Thank you for allowing me to perform your Medicare Annual Wellness Visit and for your ongoing commitment to your health.   Health Maintenance & Immunization History Health Maintenance  Topic Date Due  . COVID-19 Vaccine (3 - Pfizer risk series) 07/02/2021 (Originally 07/28/2020)  . Zoster Vaccines- Shingrix (1 of 2) 09/16/2021 (Originally 07/09/1975)  . COLONOSCOPY (Pts 45-30yr Insurance coverage will need to be confirmed)  06/16/2022 (Originally 03/01/2022)  . PNA vac Low Risk Adult (2 of 2 - PPSV23) 06/16/2022 (Originally 07/14/2020)  . INFLUENZA VACCINE  06/21/2021  . MAMMOGRAM  07/29/2022  . TETANUS/TDAP  07/26/2027  . DEXA SCAN  Completed  . Hepatitis C Screening  Completed  . HPV VACCINES  Aged Out   Immunization History  Administered Date(s) Administered  . Influenza Split 08/02/2012, 11/26/2016  . Influenza, High Dose Seasonal PF 09/05/2019  . Influenza, Seasonal, Injecte, Preservative Fre 09/15/2015  . Influenza-Unspecified 08/17/2017, 09/01/2018, 08/22/2019  . PFIZER(Purple Top)SARS-COV-2 Vaccination 06/09/2020, 06/30/2020  . Pneumococcal Conjugate-13 07/15/2019  . Tdap 08/02/2012, 07/25/2017    These are the patient goals that we discussed:  Goals Addressed               This Visit's Progress   .  Patient Stated (pt-stated)        06/16/2021 AWV Goal: Exercise for General Health  Patient will verbalize understanding of the benefits of increased physical activity: Exercising regularly is important. It will improve your overall fitness, flexibility, and endurance. Regular exercise also will improve your overall health. It can help you control your weight, reduce stress, and improve your bone density. Over the next year, patient will increase physical activity as tolerated with a goal of at least 150 minutes of moderate physical activity per week.  You can tell that  you are exercising at a moderate intensity if your heart starts beating faster and you start breathing faster but can still hold a conversation. Moderate-intensity exercise ideas include: Walking 1 mile (1.6 km) in about 15 minutes Biking Hiking Golfing Dancing Water aerobics Patient will verbalize understanding of everyday activities that increase physical activity by providing examples like the following: Yard work, such as: PSales promotion account executiveGardening Washing windows or floors Patient will be able to explain general safety guidelines for exercising:  Before you start a new exercise program, talk with your health care provider. Do not exercise so much that you hurt yourself, feel dizzy, or get very short of breath. Wear comfortable clothes and wear shoes with good support. Drink plenty of water while you exercise to prevent dehydration or heat stroke. Work out until your breathing and your heartbeat get faster.          This is a list of Health Maintenance Items that are overdue or due now: Pneumococcal vaccine  Screening mammography Shingles vaccine.  Orders/Referrals Placed Today: No orders of the defined types were placed in this encounter.  (Contact our referral department at [phone] you have not spoken with someone about your referral appointment within the next 5 days)    Follow-up Plan Follow-up with BDonella Stade PA-C as planned Schedule your shingles vaccine at your pharmacy. Referral for your mammogram has been sent and you are due after July 29 2021. They will call you to schedule.  Pneumonia vaccine can be done at your appointment  in August.  Medicare wellness visit in one year.  Patient will access AVS on my chart.      Health Maintenance, Female Adopting a healthy lifestyle and getting preventive care are important in promoting health and wellness. Ask  your health care provider about: The right schedule for you to have regular tests and exams. Things you can do on your own to prevent diseases and keep yourself healthy. What should I know about diet, weight, and exercise? Eat a healthy diet  Eat a diet that includes plenty of vegetables, fruits, low-fat dairy products, and lean protein. Do not eat a lot of foods that are high in solid fats, added sugars, or sodium.  Maintain a healthy weight Body mass index (BMI) is used to identify weight problems. It estimates body fat based on height and weight. Your health care provider can help determineyour BMI and help you achieve or maintain a healthy weight. Get regular exercise Get regular exercise. This is one of the most important things you can do for your health. Most adults should: Exercise for at least 150 minutes each week. The exercise should increase your heart rate and make you sweat (moderate-intensity exercise). Do strengthening exercises at least twice a week. This is in addition to the moderate-intensity exercise. Spend less time sitting. Even light physical activity can be beneficial. Watch cholesterol and blood lipids Have your blood tested for lipids and cholesterol at 67 years of age, then havethis test every 5 years. Have your cholesterol levels checked more often if: Your lipid or cholesterol levels are high. You are older than 67 years of age. You are at high risk for heart disease. What should I know about cancer screening? Depending on your health history and family history, you may need to have cancer screening at various ages. This may include screening for: Breast cancer. Cervical cancer. Colorectal cancer. Skin cancer. Lung cancer. What should I know about heart disease, diabetes, and high blood pressure? Blood pressure and heart disease High blood pressure causes heart disease and increases the risk of stroke. This is more likely to develop in people who have high  blood pressure readings, are of African descent, or are overweight. Have your blood pressure checked: Every 3-5 years if you are 12-42 years of age. Every year if you are 35 years old or older. Diabetes Have regular diabetes screenings. This checks your fasting blood sugar level. Have the screening done: Once every three years after age 41 if you are at a normal weight and have a low risk for diabetes. More often and at a younger age if you are overweight or have a high risk for diabetes. What should I know about preventing infection? Hepatitis B If you have a higher risk for hepatitis B, you should be screened for this virus. Talk with your health care provider to find out if you are at risk forhepatitis B infection. Hepatitis C Testing is recommended for: Everyone born from [birth year] through 1967. Anyone with known risk factors for hepatitis C. Sexually transmitted infections (STIs) Get screened for STIs, including gonorrhea and chlamydia, if: You are sexually active and are younger than 67 years of age. You are older than 67 years of age and your health care provider tells you that you are at risk for this type of infection. Your sexual activity has changed since you were last screened, and you are at increased risk for chlamydia or gonorrhea. Ask your health care provider if you are at risk. Ask your  health care provider about whether you are at high risk for HIV. Your health care provider may recommend a prescription medicine to help prevent HIV infection. If you choose to take medicine to prevent HIV, you should first get tested for HIV. You should then be tested every 3 months for as long as you are taking the medicine. Pregnancy If you are about to stop having your period (premenopausal) and you may become pregnant, seek counseling before you get pregnant. Take 400 to 800 micrograms (mcg) of folic acid every day if you become pregnant. Ask for birth control (contraception) if you want to  prevent pregnancy. Osteoporosis and menopause Osteoporosis is a disease in which the bones lose minerals and strength with aging. This can result in bone fractures. If you are 52 years old or older, or if you are at risk for osteoporosis and fractures, ask your health care provider if you should: Be screened for bone loss. Take a calcium or vitamin D supplement to lower your risk of fractures. Be given hormone replacement therapy (HRT) to treat symptoms of menopause. Follow these instructions at home: Lifestyle Do not use any products that contain nicotine or tobacco, such as cigarettes, e-cigarettes, and chewing tobacco. If you need help quitting, ask your health care provider. Do not use street drugs. Do not share needles. Ask your health care provider for help if you need support or information about quitting drugs. Alcohol use Do not drink alcohol if: Your health care provider tells you not to drink. You are pregnant, may be pregnant, or are planning to become pregnant. If you drink alcohol: Limit how much you use to 0-1 drink a day. Limit intake if you are breastfeeding. Be aware of how much alcohol is in your drink. In the [place] one drink equals one 12 oz bottle of beer (355 mL), one 5 oz glass of wine (148 mL), or one 1 oz glass of hard liquor (44 mL). General instructions Schedule regular health, dental, and eye exams. Stay current with your vaccines. Tell your health care provider if: You often feel depressed. You have ever been abused or do not feel safe at home. Summary Adopting a healthy lifestyle and getting preventive care are important in promoting health and wellness. Follow your health care provider's instructions about healthy diet, exercising, and getting tested or screened for diseases. Follow your health care provider's instructions on monitoring your cholesterol and blood pressure. This information is not intended to replace advice given to you by your health care  provider. Make sure you discuss any questions you have with your healthcare provider. Document Revised: 10/31/2018 Document Reviewed: 10/31/2018 Elsevier Patient Education  2022 [employer].  Hearing Loss Hearing loss is a partial or total loss of the ability to hear. This can betemporary or permanent, and it can happen in one or both ears. Medical care is necessary to treat hearing loss properly and to prevent the condition from getting worse. Your hearing may partially or completely come back, depending on what caused your hearing loss and how severe it is. In somecases, hearing loss is permanent. What are the causes? Common causes of hearing loss include: Too much wax in the ear canal. Infection of the ear canal or middle ear. Fluid in the middle ear. Injury to the ear or surrounding area. An object stuck in the ear. A history of prolonged exposure to loud sounds, such as music. Less common causes of hearing loss include: Tumors in the ear. Viral or bacterial infections,  such as meningitis. A hole in the eardrum (perforated eardrum). Problems with the hearing nerve that sends signals between the brain and the ear. Certain medicines. What are the signs or symptoms? Symptoms of this condition may include: Difficulty telling the difference between sounds. Difficulty following a conversation when there is background noise. Lack of response to sounds in your environment. This may be most noticeable when you do not respond to startling sounds. Needing to turn up the volume on the television, radio, or other devices. Ringing in the ears. Dizziness. How is this diagnosed? This condition is diagnosed based on: A physical exam. A hearing test (audiometry). The audiometry test will be performed by a hearing specialist (audiologist). You may also be referred to an ear, nose, and throat (ENT) specialist (otolaryngologist). How is this treated? Treatment for hearing loss may include: Ear  wax removal. Medicines to treat or prevent infection (antibiotics). Medicines to reduce inflammation (corticosteroids). Hearing aids for hearing loss related to nerve damage. Follow these instructions at home: If you were prescribed an antibiotic medicine, take it as told by your health care provider. Do not stop taking the antibiotic even if you start to feel better. Take over-the-counter and prescription medicines only as told by your health care provider. Avoid loud noises. Return to your normal activities as told by your health care provider. Ask your health care provider what activities are safe for you. Keep all follow-up visits as told by your health care provider. This is important. Contact a health care provider if: You feel dizzy. You develop new symptoms. You vomit or feel nauseous. You have a fever. Get help right away if: You develop sudden changes in your vision. You have severe ear pain. You have new or increased weakness. You have a severe headache. Summary Hearing loss is a decreased ability to hear sounds around you. It can be temporary or permanent. Treatment will depend on the cause of your hearing loss. It may include ear wax removal, medicines, or a hearing aid. Your hearing may partially or completely come back, depending on what caused your hearing loss and how severe it is. Keep all follow-up visits as told by your health care provider. This is important. This information is not intended to replace advice given to you by your health care provider. Make sure you discuss any questions you have with your healthcare provider. Document Revised: 08/07/2018 Document Reviewed: 08/07/2018 Elsevier Patient Education  [place].

## 2021-06-16 NOTE — Progress Notes (Signed)
MEDICARE ANNUAL WELLNESS VISIT  06/16/2021  Telephone Visit Disclaimer This Medicare AWV was conducted by telephone due to national recommendations for restrictions regarding the COVID-19 Pandemic (e.g. social distancing).  I verified, using two identifiers, that I am speaking with Debra Brown or their authorized healthcare agent. I discussed the limitations, risks, security, and privacy concerns of performing an evaluation and management service by telephone and the potential availability of an in-person appointment in the future. The patient expressed understanding and agreed to proceed.  Location of Patient: Home Location of Provider (nurse):  In the office.  Subjective:    Debra Brown is a 65 y.o. female patient of Alden Hipp, Royetta Car, PA-C who had a [employer] Visit today via telephone. [name] is Retired and lives with their spouse. she has 2 children. she reports that she is socially active and does interact with friends/family regularly. she is moderately physically active and enjoys cooking and gardening.  Patient Care Team: Lavada Mesi as PCP - General (Family Medicine)  Advanced Directives 06/16/2021 01/08/2020  Does Patient Have a Medical Advance Directive? Yes Yes  Type of Advance Directive Living will;Healthcare Power of [place];Living will  Does patient want to make changes to medical advance directive? No - Patient declined -  Copy of [place] in Chart? No - copy requested [facility] Utilization Over the Past 12 Months: # of hospitalizations or ER visits: 0 # of surgeries: 0  Review of Systems    Patient reports that her overall health is unchanged compared to last year.  History obtained from chart review and the patient  Patient Reported Readings (BP, Pulse, CBG, Weight, etc) none  Pain Assessment Pain : No/denies pain     Current Medications & Allergies (verified) Allergies  as of 06/16/2021   No Known Allergies      Medication List        Accurate as of June 16, 2021  9:24 AM. If you have any questions, ask your nurse or doctor.          CALCIUM PO Take 600 mg by mouth daily.   CENTRUM SILVER PO Take by mouth daily.   cholecalciferol 25 MCG (1000 UNIT) tablet Commonly known as: VITAMIN D3 Take 2,000 Units by mouth daily.   fexofenadine 180 MG tablet Commonly known as: ALLEGRA Take 180 mg by mouth daily.   levothyroxine 125 MCG tablet Commonly known as: SYNTHROID Take 1 tablet (125 mcg total) by mouth daily.   lisinopril-hydrochlorothiazide 20-25 MG tablet Commonly known as: ZESTORETIC Take 1 tablet by mouth daily.   Magnesium 250 MG Tabs Take by mouth daily.   PROBIOTIC-10 PO Take by mouth daily.   SYSTANE BALANCE OP Apply to eye.   TURMERIC CURCUMIN PO Take by mouth daily. 450/10 mg   VITAMIN B 12 PO Take by mouth. Once a day   ZINC PO Take 50 mg by mouth daily.        History (reviewed): Past Medical History:  Diagnosis Date   HSV-1 (herpes simplex virus 1) infection    Hyperlipidemia    Thyroid disease    Uterine cancer ([place])    Past Surgical History:  Procedure Laterality Date   ABDOMINAL HYSTERECTOMY     CESAREAN SECTION     TONSILLECTOMY AND ADENOIDECTOMY     TUBAL LIGATION     Family History  Problem Relation Age of Onset   Hyperlipidemia Mother  Hypertension Mother    Heart attack Mother    Diabetes Mother    Heart attack Father    Heart attack Sister    Diabetes Sister    Heart attack Sister    Diabetes Sister    CAD Sister    Stroke Paternal Aunt    Social History   Socioeconomic History   Marital status: Married    Spouse name: Debra Brown   Number of children: 2   Years of education: 12   Highest education level: 12th grade  Occupational History   Occupation: Retired  Tobacco Use   Smoking status: Never   Smokeless tobacco: Never  Substance and Sexual Activity   Alcohol  use: Yes    Comment: occassionally   Drug use: No   Sexual activity: Yes    Partners: Male  Other Topics Concern   Not on file  Social History Narrative   Lives with her husband along with her son and 2 grandchildren. She walks 3 miles everyday.    Social Determinants of Health   Financial Resource Strain: Low Risk    Difficulty of Paying Living Expenses: Not hard at all  Food Insecurity: No Food Insecurity   Worried About Charity fundraiser in the Last Year: Never true   [place] in the Last Year: Never true  Transportation Needs: No Transportation Needs   Lack of Transportation (Medical): No   Lack of Transportation (Non-Medical): No  Physical Activity: Sufficiently Active   Days of Exercise per Week: 7 days   Minutes of Exercise per Session: 60 min  Stress: No Stress Concern Present   Feeling of Stress : Not at all  Social Connections: Moderately Isolated   Frequency of Communication with Friends and Family: More than three times a week   Frequency of Social Gatherings with Friends and Family: More than three times a week   Attends Religious Services: Never   Marine scientist or Organizations: No   Attends Archivist Meetings: Never   Marital Status: Married    Activities of Daily Living In your present state of health, do you have any difficulty performing the following activities: 06/16/2021  Hearing? Y  Comment She has noticed some hearing loss when people talk really low with her.  Vision? N  Difficulty concentrating or making decisions? N  Walking or climbing stairs? N  Dressing or bathing? N  Doing errands, shopping? N  Preparing Food and eating ? N  Using the Toilet? N  In the past six months, have you accidently leaked urine? N  Do you have problems with loss of bowel control? N  Managing your Medications? N  Managing your Finances? N  Housekeeping or managing your Housekeeping? N  Some recent data might be hidden    Patient  Education/ Literacy How often do you need to have someone help you when you read instructions, pamphlets, or other written materials from your doctor or pharmacy?: 1 - Never What is the last grade level you completed in school?: High School Graduate (12th grade)  Exercise Current Exercise Habits: Home exercise routine, Type of exercise: walking, Time (Minutes): 60, Frequency (Times/Week): 7, Weekly Exercise (Minutes/Week): 420, Intensity: Moderate, Exercise limited by: None identified  Diet Patient reports consuming 3 meals a day and 2 snack(s) a day Patient reports that her primary diet is: Regular Patient reports that she does have regular access to food.   Depression Screen PHQ 2/9 Scores 06/16/2021 07/06/2020 01/08/2020  PHQ - 2 Score 0 0 0  PHQ- 9 Score - - 0     Fall Risk Fall Risk  06/16/2021 07/06/2020  Falls in the past year? 0 0  Number falls in past yr: 0 0  Injury with Fall? 0 0  Risk for fall due to : No Fall Risks -  Follow up Falls evaluation completed Falls evaluation completed     Objective:  Amarii Midgley seemed alert and oriented and she participated appropriately during our telephone visit.  Blood Pressure Weight BMI  BP Readings from Last 3 Encounters:  07/06/20 (!) 129/56  01/08/20 130/84  08/09/12 (!) 154/78   Wt Readings from Last 3 Encounters:  07/06/20 145 lb (65.8 kg)  04/13/20 143 lb 8 oz (65.1 kg)  01/08/20 147 lb (66.7 kg)   BMI Readings from Last 1 Encounters:  07/06/20 26.95 kg/m    *Unable to obtain current vital signs, weight, and BMI due to telephone visit type  Hearing/Vision  Particia did not seem to have difficulty with hearing/understanding during the telephone conversation Reports that she has had a formal eye exam by an eye care professional within the past year Reports that she has not had a formal hearing evaluation within the past year *Unable to fully assess hearing and vision during telephone visit type  Cognitive  Function: 6CIT Screen 06/16/2021  What Year? 0 points  What month? 0 points  What time? 0 points  Count back from 20 0 points  Months in reverse 0 points  Repeat phrase 0 points  Total Score 0   (Normal:0-7, Significant for Dysfunction: >8)  Normal Cognitive Function Screening: Yes   Immunization & Health Maintenance Record Immunization History  Administered Date(s) Administered   Influenza Split 08/02/2012, 11/26/2016   Influenza, High Dose Seasonal PF 09/05/2019   Influenza, Seasonal, Injecte, Preservative Fre 09/15/2015   Influenza-Unspecified 08/17/2017, 09/01/2018, 08/22/2019   PFIZER(Purple Top)SARS-COV-2 Vaccination 06/09/2020, 06/30/2020   Pneumococcal Conjugate-13 07/15/2019   Tdap 08/02/2012, 07/25/2017    Health Maintenance  Topic Date Due   COVID-19 Vaccine (3 - Pfizer risk series) 07/02/2021 (Originally 07/28/2020)   Zoster Vaccines- Shingrix (1 of 2) 09/16/2021 (Originally 07/09/1975)   COLONOSCOPY (Pts 45-5yr Insurance coverage will need to be confirmed)  06/16/2022 (Originally 03/01/2022)   PNA vac Low Risk Adult (2 of 2 - PPSV23) 06/16/2022 (Originally 07/14/2020)   INFLUENZA VACCINE  06/21/2021   MAMMOGRAM  07/29/2022   TETANUS/TDAP  07/26/2027   DEXA SCAN  Completed   Hepatitis C Screening  Completed   HPV VACCINES  Aged Out       Assessment  This is a routine wellness examination for [employer]  Health Maintenance: Due or Overdue There are no preventive care reminders to display for this patient.   JNorva Riffledoes not need a referral for Community Assistance: Care Management:   no Social Work:    no Prescription Assistance:  no Nutrition/Diabetes Education:  no   Plan:  Personalized Goals  Goals Addressed               This Visit's Progress     Patient Stated (pt-stated)        06/16/2021 AWV Goal: Exercise for General Health  Patient will verbalize understanding of the benefits of increased physical activity: Exercising  regularly is important. It will improve your overall fitness, flexibility, and endurance. Regular exercise also will improve your overall health. It can help you control your weight, reduce stress, and improve your bone density.  Over the next year, patient will increase physical activity as tolerated with a goal of at least 150 minutes of moderate physical activity per week.  You can tell that you are exercising at a moderate intensity if your heart starts beating faster and you start breathing faster but can still hold a conversation. Moderate-intensity exercise ideas include: Walking 1 mile (1.6 km) in about 15 minutes Biking Hiking Golfing Dancing Water aerobics Patient will verbalize understanding of everyday activities that increase physical activity by providing examples like the following: Yard work, such as: Sales promotion account executive Gardening Washing windows or floors Patient will be able to explain general safety guidelines for exercising:  Before you start a new exercise program, talk with your health care provider. Do not exercise so much that you hurt yourself, feel dizzy, or get very short of breath. Wear comfortable clothes and wear shoes with good support. Drink plenty of water while you exercise to prevent dehydration or heat stroke. Work out until your breathing and your heartbeat get faster.        Personalized Health Maintenance & Screening Recommendations  Pneumococcal vaccine  Screening mammography Shingles vaccine.  Lung Cancer Screening Recommended: no (Low Dose CT Chest recommended if Age 36-80 years, 30 pack-year currently smoking OR have quit w/in past 15 years) Hepatitis C Screening recommended: no HIV Screening recommended: no  Advanced Directives: Written information was not prepared per patient's request.  Referrals & Orders No orders of the defined types were placed in  this encounter.   Follow-up Plan Follow-up with Donella Stade, PA-C as planned Schedule your shingles vaccine at your pharmacy. Referral for your mammogram has been sent and you are due after July 29 2021. They will call you to schedule.  Pneumonia vaccine can be done at your appointment in August.  Medicare wellness visit in one year.  Patient will access AVS on my chart.   I have personally reviewed and noted the following in the patient's chart:   Medical and social history Use of alcohol, tobacco or illicit drugs  Current medications and supplements Functional ability and status Nutritional status Physical activity Advanced directives List of other physicians Hospitalizations, surgeries, and ER visits in previous 12 months Vitals Screenings to include cognitive, depression, and falls Referrals and appointments  In addition, I have reviewed and discussed with Debra Brown certain preventive protocols, quality metrics, and best practice recommendations. A written personalized care plan for preventive services as well as general preventive health recommendations is available and can be mailed to the patient at her request.      Tinnie Gens, RN  06/16/2021

## 2021-07-07 ENCOUNTER — Encounter: Payer: Medicare HMO | Admitting: Physician Assistant

## 2021-07-13 ENCOUNTER — Other Ambulatory Visit: Payer: Self-pay

## 2021-07-13 ENCOUNTER — Ambulatory Visit (INDEPENDENT_AMBULATORY_CARE_PROVIDER_SITE_OTHER): Payer: Medicare HMO | Admitting: Physician Assistant

## 2021-07-13 ENCOUNTER — Encounter: Payer: Self-pay | Admitting: Physician Assistant

## 2021-07-13 VITALS — BP 127/64 | HR 65 | Ht 61.5 in | Wt 142.1 lb

## 2021-07-13 DIAGNOSIS — L570 Actinic keratosis: Secondary | ICD-10-CM | POA: Diagnosis not present

## 2021-07-13 DIAGNOSIS — Z0000 Encounter for general adult medical examination without abnormal findings: Secondary | ICD-10-CM | POA: Diagnosis not present

## 2021-07-13 DIAGNOSIS — E7800 Pure hypercholesterolemia, unspecified: Secondary | ICD-10-CM | POA: Diagnosis not present

## 2021-07-13 DIAGNOSIS — R7989 Other specified abnormal findings of blood chemistry: Secondary | ICD-10-CM | POA: Diagnosis not present

## 2021-07-13 DIAGNOSIS — E890 Postprocedural hypothyroidism: Secondary | ICD-10-CM | POA: Diagnosis not present

## 2021-07-13 DIAGNOSIS — Z13220 Encounter for screening for lipoid disorders: Secondary | ICD-10-CM | POA: Diagnosis not present

## 2021-07-13 DIAGNOSIS — R0982 Postnasal drip: Secondary | ICD-10-CM | POA: Diagnosis not present

## 2021-07-13 DIAGNOSIS — E039 Hypothyroidism, unspecified: Secondary | ICD-10-CM

## 2021-07-13 DIAGNOSIS — Z23 Encounter for immunization: Secondary | ICD-10-CM | POA: Diagnosis not present

## 2021-07-13 DIAGNOSIS — I10 Essential (primary) hypertension: Secondary | ICD-10-CM | POA: Diagnosis not present

## 2021-07-13 MED ORDER — LISINOPRIL-HYDROCHLOROTHIAZIDE 20-25 MG PO TABS: 1.0000 | ORAL_TABLET | Freq: Every day | ORAL | 1 refills | Status: DC

## 2021-07-13 MED ORDER — LEVOTHYROXINE SODIUM 125 MCG PO TABS: 125.0000 ug | ORAL_TABLET | Freq: Every day | ORAL | 3 refills | Status: DC

## 2021-07-13 MED ORDER — MONTELUKAST SODIUM 10 MG PO TABS: 10.0000 mg | ORAL_TABLET | Freq: Every day | ORAL | 3 refills | Status: DC

## 2021-07-13 MED ORDER — FLUTICASONE PROPIONATE 50 MCG/ACT NA SUSP: 2.0000 | Freq: Every day | NASAL | 2 refills | Status: DC

## 2021-07-13 NOTE — Patient Instructions (Signed)

## 2021-07-13 NOTE — Progress Notes (Signed)
Subjective:     Debra Brown is a 65 y.o. female and is here for a comprehensive physical exam. The patient reports problems - ongoing congestion in mornings that gets better throughout the day. Flonase and allegra make better but not resolved .  Social History   Socioeconomic History   Marital status: Married    Spouse name: Debra Brown   Number of children: 2   Years of education: 12   Highest education level: 12th grade  Occupational History   Occupation: Retired  Tobacco Use   Smoking status: Never   Smokeless tobacco: Never  Substance and Sexual Activity   Alcohol use: Yes    Comment: occassionally   Drug use: No   Sexual activity: Yes    Partners: Male  Other Topics Concern   Not on file  Social History Narrative   Lives with her husband along with her son and 2 grandchildren. She walks 3 miles everyday.    Social Determinants of Health   Financial Resource Strain: Low Risk    Difficulty of Paying Living Expenses: Not hard at all  Food Insecurity: No Food Insecurity   Worried About Charity fundraiser in the Last Year: Never true   [place] in the Last Year: Never true  Transportation Needs: No Transportation Needs   Lack of Transportation (Medical): No   Lack of Transportation (Non-Medical): No  Physical Activity: Sufficiently Active   Days of Exercise per Week: 7 days   Minutes of Exercise per Session: 60 min  Stress: No Stress Concern Present   Feeling of Stress : Not at all  Social Connections: Moderately Isolated   Frequency of Communication with Friends and Family: More than three times a week   Frequency of Social Gatherings with Friends and Family: More than three times a week   Attends Religious Services: Never   Marine scientist or Organizations: No   Attends Archivist Meetings: Never   Marital Status: Married  Human resources officer Violence: Not At Risk   Fear of Current or Ex-Partner: No   Emotionally Abused: No   Physically  Abused: No   Sexually Abused: No   Health Maintenance  Topic Date Due   COVID-19 Vaccine (3 - Pfizer risk series) 07/28/2020   INFLUENZA VACCINE  06/21/2021   Zoster Vaccines- Shingrix (1 of 2) 09/16/2021 (Originally 07/09/1975)   COLONOSCOPY (Pts 45-20yr Insurance coverage will need to be confirmed)  06/16/2022 (Originally 03/01/2022)   PNA vac Low Risk Adult (2 of 2 - PPSV23) 06/16/2022 (Originally 07/14/2020)   MAMMOGRAM  07/29/2022   TETANUS/TDAP  07/26/2027   DEXA SCAN  Completed   Hepatitis C Screening  Completed   HPV VACCINES  Aged Out    The following portions of the patient's history were reviewed and updated as appropriate: allergies, current medications, past family history, past medical history, past social history, past surgical history, and problem list.  Review of Systems Pertinent items noted in HPI and remainder of comprehensive ROS otherwise negative.   Objective:    BP 127/64 (BP Location: Right Arm, Patient Position: Sitting, Cuff Size: Normal)   Pulse 65   Wt 142 lb 1.9 oz (64.5 kg)   SpO2 100%   BMI 26.42 kg/m  General appearance: alert, cooperative, and appears stated age Head: Normocephalic, without obvious abnormality, atraumatic Eyes: conjunctivae/corneas clear. PERRL, EOM's intact. Fundi benign. Ears: normal TM's and external ear canals both ears Nose: Nares normal. Septum midline. Mucosa normal. No  drainage or sinus tenderness. Throat: lips, mucosa, and tongue normal; teeth and gums normal Neck: no adenopathy, no carotid bruit, no JVD, supple, symmetrical, trachea midline, and thyroid not enlarged, symmetric, no tenderness/mass/nodules Back: symmetric, no curvature. ROM normal. No CVA tenderness. Lungs: clear to auscultation bilaterally Heart: regular rate and rhythm, S1, S2 normal, no murmur, click, rub or gallop Abdomen: soft, non-tender; bowel sounds normal; no masses,  no organomegaly Extremities: extremities normal, atraumatic, no cyanosis or  edema Pulses: 2+ and symmetric Skin: Skin color, texture, turgor normal. No rashes or lesions Lymph nodes: Cervical, supraclavicular, and axillary nodes normal. Neurologic: Alert and oriented X 3, normal strength and tone. Normal symmetric reflexes. Normal coordination and gait    .[name] Depression screen [facility] 2/9 07/13/2021 07/13/2021 06/16/2021 07/06/2020 01/08/2020  Decreased Interest 0 0 0 0 0  Down, Depressed, Hopeless 0 0 0 0 0  PHQ - 2 Score 0 0 0 0 0  Altered sleeping 0 - - - 0  Tired, decreased energy 0 - - - 0  Change in appetite 0 - - - 0  Feeling bad or failure about yourself  0 - - - 0  Trouble concentrating 0 - - - 0  Moving slowly or fidgety/restless 0 - - - 0  Suicidal thoughts 0 - - - 0  PHQ-9 Score 0 - - - 0  Difficult doing work/chores - - - - Not difficult at all   .[name] GAD 7 : Generalized Anxiety Score 07/13/2021 01/08/2020  Nervous, Anxious, on Edge 0 0  Control/stop worrying 0 0  Worry too much - different things 0 0  Trouble relaxing 0 0  Restless 0 0  Easily annoyed or irritable 0 0  Afraid - awful might happen 0 0  Total GAD 7 Score 0 0  Anxiety Difficulty - Not difficult at all     Assessment:    Healthy female exam.    Plan:    .Marland KitchenBraileigh was seen today for annual exam.  Diagnoses and all orders for this visit:  Routine physical examination -     TSH -     Lipid Panel w/reflex Direct LDL -     COMPLETE METABOLIC PANEL WITH GFR -     CBC with Differential/Platelet  Hypothyroidism, unspecified type -     TSH  Screening for lipid disorders -     Lipid Panel w/reflex Direct LDL  Elevated LDL cholesterol level -     Lipid Panel w/reflex Direct LDL  Essential hypertension -     lisinopril-hydrochlorothiazide (ZESTORETIC) 20-25 MG tablet; Take 1 tablet by mouth daily.  Postablative hypothyroidism -     levothyroxine (SYNTHROID) 125 MCG tablet; Take 1 tablet (125 mcg total) by mouth daily.  PND (post-nasal drip) -     fluticasone (FLONASE) 50  MCG/ACT nasal spray; Place 2 sprays into both nostrils daily. -     montelukast (SINGULAIR) 10 MG tablet; Take 1 tablet (10 mg total) by mouth at bedtime.  Sun-induced skin changes, keratosis -     Ambulatory referral to Dermatology  .[name] Discussed 150 minutes of exercise a week.  Encouraged vitamin D 1000 units and Calcium '1300mg'$  or 4 servings of dairy a day.  Fasting labs ordered today.  PHQ/GAD no concerns.  Pneumonia 23 vaccine given today.  Reminder for covid booster, flu shot and shingles.  Colonoscopy per patient done 2013 at [facility]. Will get records. Due next year.  Mammogram scheduled.  Bone density UTD.   Vitals look  great. Medications refilled.  Added singulair to flonase and allegra to see if helps with morning congestion/PND.   Skin changes due to sun exposure. Referral for skin checks at dermatology.   Follow up in 6 months.  See After Visit Summary for Counseling Recommendations

## 2021-07-13 NOTE — Addendum Note (Signed)
Addended by: Gust Brooms on: 07/13/2021 11:23 AM   Modules accepted: Orders

## 2021-07-14 ENCOUNTER — Encounter: Payer: Self-pay | Admitting: Physician Assistant

## 2021-07-14 DIAGNOSIS — D582 Other hemoglobinopathies: Secondary | ICD-10-CM | POA: Insufficient documentation

## 2021-07-14 MED ORDER — ATORVASTATIN CALCIUM 20 MG PO TABS: 20.0000 mg | ORAL_TABLET | Freq: Every day | ORAL | 3 refills | Status: DC

## 2021-07-14 NOTE — Progress Notes (Signed)
Debra Brown,   Thyroid looks great.  Kidney, liver, glucose looks great.  LDL, bad cholesterol is elevated.  HDL, good cholesterol looks good.  10 year CV risk is 8.3 percent. Guidelines do suggest starting a statin to help lower cholesterol and overall risk? Are you ok with starting?   Your hemoglobin, RBC, and hematocrit are all elevated. Please add ferritin and iron panel. At times can be elevated due to dehydration before labs. We will recheck before any further work up.

## 2021-07-14 NOTE — Progress Notes (Signed)
Sent lipitor

## 2021-07-15 LAB — TSH: TSH: 1.73 mIU/L (ref 0.40–4.50)

## 2021-07-15 LAB — COMPLETE METABOLIC PANEL WITH GFR
AG Ratio: 1.7 (calc) (ref 1.0–2.5)
ALT: 16 U/L (ref 6–29)
AST: 17 U/L (ref 10–35)
Albumin: 4.5 g/dL (ref 3.6–5.1)
Alkaline phosphatase (APISO): 84 U/L (ref 37–153)
BUN: 15 mg/dL (ref 7–25)
CO2: 30 mmol/L (ref 20–32)
Calcium: 9.7 mg/dL (ref 8.6–10.4)
Chloride: 102 mmol/L (ref 98–110)
Creat: 0.73 mg/dL (ref 0.50–1.05)
Globulin: 2.7 g/dL (calc) (ref 1.9–3.7)
Glucose, Bld: 82 mg/dL (ref 65–99)
Potassium: 4 mmol/L (ref 3.5–5.3)
Sodium: 140 mmol/L (ref 135–146)
Total Bilirubin: 0.5 mg/dL (ref 0.2–1.2)
Total Protein: 7.2 g/dL (ref 6.1–8.1)
eGFR: 90 mL/min/{1.73_m2} (ref >=60)

## 2021-07-15 LAB — CBC WITH DIFFERENTIAL/PLATELET
Absolute Monocytes: 440 cells/uL (ref 200–950)
Basophils Absolute: 43 cells/uL (ref 0–200)
Basophils Relative: 0.6 %
Eosinophils Absolute: 383 cells/uL (ref 15–500)
Eosinophils Relative: 5.4 %
HCT: 51.1 % — ABNORMAL HIGH (ref 35.0–45.0)
Hemoglobin: 17 g/dL — ABNORMAL HIGH (ref 11.7–15.5)
Lymphs Abs: 1527 cells/uL (ref 850–3900)
MCH: 28.8 pg (ref 27.0–33.0)
MCHC: 33.3 g/dL (ref 32.0–36.0)
MCV: 86.6 fL (ref 80.0–100.0)
MPV: 10.5 fL (ref 7.5–12.5)
Monocytes Relative: 6.2 %
Neutro Abs: 4707 cells/uL (ref 1500–7800)
Neutrophils Relative %: 66.3 %
Platelets: 286 10*3/uL (ref 140–400)
RBC: 5.9 10*6/uL — ABNORMAL HIGH (ref 3.80–5.10)
RDW: 12.7 % (ref 11.0–15.0)
Total Lymphocyte: 21.5 %
WBC: 7.1 10*3/uL (ref 3.8–10.8)

## 2021-07-15 LAB — LIPID PANEL W/REFLEX DIRECT LDL
Cholesterol: 205 mg/dL — ABNORMAL HIGH (ref <200)
HDL: 65 mg/dL (ref >=50)
LDL Cholesterol (Calc): 123 mg/dL (calc) — ABNORMAL HIGH
Non-HDL Cholesterol (Calc): 140 mg/dL (calc) — ABNORMAL HIGH (ref <130)
Total CHOL/HDL Ratio: 3.2 (calc) (ref <5.0)
Triglycerides: 80 mg/dL (ref <150)

## 2021-07-15 LAB — IRON,TIBC AND FERRITIN PANEL
%SAT: 42 % (calc) (ref 16–45)
Ferritin: 19 ng/mL (ref 16–288)
Iron: 165 ug/dL — ABNORMAL HIGH (ref 45–160)
TIBC: 391 mcg/dL (calc) (ref 250–450)

## 2021-07-16 ENCOUNTER — Other Ambulatory Visit: Payer: Self-pay | Admitting: Neurology

## 2021-07-16 DIAGNOSIS — R790 Abnormal level of blood mineral: Secondary | ICD-10-CM

## 2021-07-16 NOTE — Progress Notes (Signed)
Stop any extra iron. Iron levels are just above normal. Make sure you are really hydrated and recheck CBC in 1 month.

## 2021-08-02 DIAGNOSIS — L814 Other melanin hyperpigmentation: Secondary | ICD-10-CM | POA: Diagnosis not present

## 2021-08-02 DIAGNOSIS — D225 Melanocytic nevi of trunk: Secondary | ICD-10-CM | POA: Diagnosis not present

## 2021-08-02 DIAGNOSIS — L853 Xerosis cutis: Secondary | ICD-10-CM | POA: Diagnosis not present

## 2021-08-02 DIAGNOSIS — L821 Other seborrheic keratosis: Secondary | ICD-10-CM | POA: Diagnosis not present

## 2021-08-05 ENCOUNTER — Other Ambulatory Visit: Payer: Self-pay

## 2021-08-05 ENCOUNTER — Ambulatory Visit (INDEPENDENT_AMBULATORY_CARE_PROVIDER_SITE_OTHER): Payer: Medicare HMO

## 2021-08-05 DIAGNOSIS — Z0000 Encounter for general adult medical examination without abnormal findings: Secondary | ICD-10-CM

## 2021-08-05 DIAGNOSIS — Z1231 Encounter for screening mammogram for malignant neoplasm of breast: Secondary | ICD-10-CM | POA: Diagnosis not present

## 2021-08-10 NOTE — Progress Notes (Signed)
There is some possible distortion in left breast. Further imaging is needed. Imaging should contact you for scheduling.

## 2021-08-11 ENCOUNTER — Other Ambulatory Visit: Payer: Self-pay | Admitting: Physician Assistant

## 2021-08-11 DIAGNOSIS — R928 Other abnormal and inconclusive findings on diagnostic imaging of breast: Secondary | ICD-10-CM

## 2021-08-27 ENCOUNTER — Ambulatory Visit
Admission: RE | Admit: 2021-08-27 | Discharge: 2021-08-27 | Disposition: A | Discharge status: Home / Self Care | Payer: Medicare HMO | Source: Ambulatory Visit | Attending: Physician Assistant | Admitting: Physician Assistant

## 2021-08-27 ENCOUNTER — Ambulatory Visit: Payer: Medicare HMO

## 2021-08-27 ENCOUNTER — Other Ambulatory Visit: Payer: Self-pay

## 2021-08-27 DIAGNOSIS — R922 Inconclusive mammogram: Secondary | ICD-10-CM | POA: Diagnosis not present

## 2021-08-27 DIAGNOSIS — R928 Other abnormal and inconclusive findings on diagnostic imaging of breast: Secondary | ICD-10-CM

## 2021-08-27 NOTE — Progress Notes (Signed)
No suspicious findings. Screening mammogram in one year.

## 2021-09-15 ENCOUNTER — Ambulatory Visit (INDEPENDENT_AMBULATORY_CARE_PROVIDER_SITE_OTHER): Payer: Medicare HMO | Admitting: Physician Assistant

## 2021-09-15 DIAGNOSIS — Z23 Encounter for immunization: Secondary | ICD-10-CM

## 2021-09-15 NOTE — Progress Notes (Signed)
Flu shot given today without complications.  

## 2021-10-07 ENCOUNTER — Ambulatory Visit (INDEPENDENT_AMBULATORY_CARE_PROVIDER_SITE_OTHER): Payer: Medicare HMO

## 2021-10-07 ENCOUNTER — Other Ambulatory Visit: Payer: Self-pay | Admitting: Family Medicine

## 2021-10-07 ENCOUNTER — Other Ambulatory Visit: Payer: Self-pay

## 2021-10-07 ENCOUNTER — Ambulatory Visit (INDEPENDENT_AMBULATORY_CARE_PROVIDER_SITE_OTHER): Payer: Medicare HMO | Admitting: Family Medicine

## 2021-10-07 ENCOUNTER — Encounter: Payer: Self-pay | Admitting: Family Medicine

## 2021-10-07 VITALS — BP 105/46 | HR 65 | Ht 62.0 in | Wt 144.0 lb

## 2021-10-07 DIAGNOSIS — M79604 Pain in right leg: Secondary | ICD-10-CM

## 2021-10-07 DIAGNOSIS — R2241 Localized swelling, mass and lump, right lower limb: Secondary | ICD-10-CM

## 2021-10-07 DIAGNOSIS — H0520 Unspecified exophthalmos: Secondary | ICD-10-CM

## 2021-10-07 DIAGNOSIS — M7989 Other specified soft tissue disorders: Secondary | ICD-10-CM | POA: Diagnosis not present

## 2021-10-07 NOTE — Progress Notes (Signed)
Acute Office Visit  Subjective:    Patient ID: Debra Brown, female    DOB: Aug 10, 1956, 65 y.o.   MRN: [id number]  Chief Complaint  Patient presents with   Knee Pain    HPI Patient is in today for pain on her left lower leg it is just a little bit medial to the shin.  She says she has noticed that its been painful and swollen for about a month.  She does not remember any injury or trauma it is worse when she walks on it.  No prior history of blood clots.  She feels like now her left knee is starting to bother her but she thinks it is more because she is walking a little awkwardly on that lower leg.  Past Medical History:  Diagnosis Date   HSV-1 (herpes simplex virus 1) infection    Hyperlipidemia    Thyroid disease    Uterine cancer ([place])     Past Surgical History:  Procedure Laterality Date   ABDOMINAL HYSTERECTOMY     CESAREAN SECTION     TONSILLECTOMY AND ADENOIDECTOMY     TUBAL LIGATION      Family History  Problem Relation Age of Onset   Hyperlipidemia Mother    Hypertension Mother    Heart attack Mother    Diabetes Mother    Heart attack Father    Heart attack Sister    Diabetes Sister    Heart attack Sister    Diabetes Sister    CAD Sister    Stroke Paternal 65     Social History   Socioeconomic History   Marital status: Married    Spouse name: Anastassia Noack   Number of children: 2   Years of education: 12   Highest education level: 12th grade  Occupational History   Occupation: Retired  Tobacco Use   Smoking status: Never   Smokeless tobacco: Never  Substance and Sexual Activity   Alcohol use: Yes    Comment: occassionally   Drug use: No   Sexual activity: Yes    Partners: Male  Other Topics Concern   Not on file  Social History Narrative   Lives with her husband along with her son and 2 grandchildren. She walks 3 miles everyday.    Social Determinants of Health   Financial Resource Strain: Low Risk    Difficulty of Paying Living  Expenses: Not hard at all  Food Insecurity: No Food Insecurity   Worried About Charity fundraiser in the Last Year: Never true   [place] in the Last Year: Never true  Transportation Needs: No Transportation Needs   Lack of Transportation (Medical): No   Lack of Transportation (Non-Medical): No  Physical Activity: Sufficiently Active   Days of Exercise per Week: 7 days   Minutes of Exercise per Session: 60 min  Stress: No Stress Concern Present   Feeling of Stress : Not at all  Social Connections: Moderately Isolated   Frequency of Communication with Friends and Family: More than three times a week   Frequency of Social Gatherings with Friends and Family: More than three times a week   Attends Religious Services: Never   Marine scientist or Organizations: No   Attends Archivist Meetings: Never   Marital Status: Married  Human resources officer Violence: Not At Risk   Fear of Current or Ex-Partner: No   Emotionally Abused: No   Physically Abused: No   Sexually Abused: No  Outpatient Medications Prior to Visit  Medication Sig Dispense Refill   Ascorbic Acid (VITAMIN C) 100 MG tablet Take 100 mg by mouth daily.     atorvastatin (LIPITOR) 20 MG tablet Take 1 tablet (20 mg total) by mouth daily. 90 tablet 3   CALCIUM PO Take 600 mg by mouth daily.     cholecalciferol (VITAMIN D3) 25 MCG (1000 UNIT) tablet Take 2,000 Units by mouth daily.     Cyanocobalamin (VITAMIN B 12 PO) Take by mouth. Once a day     fexofenadine (ALLEGRA) 180 MG tablet Take 180 mg by mouth daily.     fluticasone (FLONASE) 50 MCG/ACT nasal spray Place 2 sprays into both nostrils daily. 16 g 2   levothyroxine (SYNTHROID) 125 MCG tablet Take 1 tablet (125 mcg total) by mouth daily. 90 tablet 3   lisinopril-hydrochlorothiazide (ZESTORETIC) 20-25 MG tablet Take 1 tablet by mouth daily. 90 tablet 1   Magnesium 250 MG TABS Take by mouth daily.     montelukast (SINGULAIR) 10 MG tablet Take 1 tablet  (10 mg total) by mouth at bedtime. 90 tablet 3   Multiple Vitamins-Minerals (CENTRUM SILVER PO) Take by mouth daily.     Multiple Vitamins-Minerals (ZINC PO) Take 50 mg by mouth daily.     Probiotic Product (PROBIOTIC-10 PO) Take by mouth daily.     Propylene Glycol (SYSTANE BALANCE OP) Apply to eye.     TURMERIC CURCUMIN PO Take by mouth daily. 450/10 mg     No facility-administered medications prior to visit.    No Known Allergies  Review of Systems     Objective:    Physical Exam Vitals reviewed.  Constitutional:      Appearance: She is well-developed.  HENT:     Head: Normocephalic and atraumatic.  Eyes:     Conjunctiva/sclera: Conjunctivae normal.  Cardiovascular:     Rate and Rhythm: Normal rate.  Pulmonary:     Effort: Pulmonary effort is normal.  Musculoskeletal:     Comments: She does have a little bit of swelling over that right lower leg just medial to the shin but above the medial malleolus it is probably to 7 cm in circumference it is round and smooth.  Its not indurated.  But it is noticeably different than her opposite leg.  It is also tender around that area.  There is no bruising or rash no increased warmth or heat or erythema.  The right knee with normal range of motion.  No significant crepitus w/ motion. .  Nontender along the joint lines or around the patella.  No increased laxity with anterior drawer.  Left ankle with normal range of motion nontender over the medial lateral malleoli.  Normal anterior drawer on the ankle.  Strength is 5 out of 5 at the right knee and ankle.  Skin:    General: Skin is dry.     Coloration: Skin is not pale.  Neurological:     Mental Status: She is alert and oriented to person, place, and time.  Psychiatric:        Behavior: Behavior normal.    BP (!) 105/46   Pulse 65   Ht 5' 2" (1.575 m)   Wt 144 lb (65.3 kg)   SpO2 100%   BMI 26.34 kg/m  Wt Readings from Last 3 Encounters:  10/07/21 144 lb (65.3 kg)  07/13/21 142  lb 1.9 oz (64.5 kg)  07/06/20 145 lb (65.8 kg)    Health Maintenance Due    Topic Date Due   Zoster Vaccines- Shingrix (1 of 2) Never done   COVID-19 Vaccine (3 - Pfizer risk series) 07/28/2020    There are no preventive care reminders to display for this patient.   Lab Results  Component Value Date   TSH 1.73 07/13/2021   Lab Results  Component Value Date   WBC 7.1 07/13/2021   HGB 17.0 (H) 07/13/2021   HCT 51.1 (H) 07/13/2021   MCV 86.6 07/13/2021   PLT 286 07/13/2021   Lab Results  Component Value Date   NA 140 07/13/2021   K 4.0 07/13/2021   CO2 30 07/13/2021   GLUCOSE 82 07/13/2021   BUN 15 07/13/2021   CREATININE 0.73 07/13/2021   BILITOT 0.5 07/13/2021   ALKPHOS 73 08/06/2012   AST 17 07/13/2021   ALT 16 07/13/2021   PROT 7.2 07/13/2021   ALBUMIN 4.1 08/06/2012   CALCIUM 9.7 07/13/2021   EGFR 90 07/13/2021   Lab Results  Component Value Date   CHOL 205 (H) 07/13/2021   Lab Results  Component Value Date   HDL 65 07/13/2021   Lab Results  Component Value Date   LDLCALC 123 (H) 07/13/2021   Lab Results  Component Value Date   TRIG 80 07/13/2021   Lab Results  Component Value Date   CHOLHDL 3.2 07/13/2021   No results found for: HGBA1C     Assessment & Plan:   Problem List Items Addressed This Visit       Other   Exophthalmos   Other Visit Diagnoses     Right leg pain    -  Primary   Relevant Orders   DG Tibia/Fibula Right   US MiscellaneoUS Localization   Leg mass, right       Relevant Orders   US MiscellaneoUS Localization       Swelling over the right lower leg just medial to the shin but above the medial malleolus.  Please evaluate with x-ray if negative will plan to schedule for ultrasound as well, to better look at the soft tissue if needed.  We will call with those results once available she will go ahead and go down and get the x-ray done today.  Unclear etiology.  I do not think it is consistent with a blood clot there is  no significant warmth or erythema and its been there for about a month.  Consider lipoma but these are usually not tender and painful.  Consider soft tissue injury or bony mass.  No orders of the defined types were placed in this encounter.    Catherine Metheney, MD  

## 2021-10-09 ENCOUNTER — Other Ambulatory Visit: Payer: Self-pay | Admitting: Physician Assistant

## 2021-10-09 DIAGNOSIS — R0982 Postnasal drip: Secondary | ICD-10-CM

## 2021-10-11 NOTE — Progress Notes (Signed)
HI Mercadies,  I saw the x-ray before I saw the ultrasound.  Sorry for the comment in the first 1 about getting an ultrasound.  The ultrasound overall looked good there were no concerning findings such as a mass or fluid collection which is very reassuring.  I think at this point I would just recommend compression with an Ace wrap for about the next 2 weeks to see if it is improving.  But again we did not see anything worrisome on exam which is very good.

## 2021-10-11 NOTE — Progress Notes (Signed)
Hi Doralyn,  great news is that they did not see anything solid or off the bone in the plain film it looks like soft tissue swelling.  And there is no sign of foreign body or gas bubbles in the tissue which could indicate infection.  I think we could move forward with an ultrasound for further evaluation if you are okay with that.  Just let us know.

## 2021-10-27 DIAGNOSIS — E890 Postprocedural hypothyroidism: Secondary | ICD-10-CM | POA: Diagnosis not present

## 2021-10-27 DIAGNOSIS — E0500 Thyrotoxicosis with diffuse goiter without thyrotoxic crisis or storm: Secondary | ICD-10-CM | POA: Diagnosis not present

## 2021-11-25 DIAGNOSIS — C541 Malignant neoplasm of endometrium: Secondary | ICD-10-CM | POA: Diagnosis not present

## 2021-11-27 ENCOUNTER — Other Ambulatory Visit: Payer: Self-pay

## 2021-11-27 ENCOUNTER — Encounter: Payer: Self-pay | Admitting: Emergency Medicine

## 2021-11-27 ENCOUNTER — Emergency Department
Admission: EM | Admit: 2021-11-27 | Discharge: 2021-11-27 | Disposition: A | Discharge status: Home / Self Care | Payer: Medicare HMO | Source: Home / Self Care | Attending: Family Medicine | Admitting: Family Medicine

## 2021-11-27 DIAGNOSIS — J0100 Acute maxillary sinusitis, unspecified: Secondary | ICD-10-CM

## 2021-11-27 MED ORDER — AMOXICILLIN 875 MG PO TABS: 875.0000 mg | ORAL_TABLET | Freq: Two times a day (BID) | ORAL | 0 refills | Status: DC

## 2021-11-27 NOTE — ED Triage Notes (Signed)
Patient c/o head congestion, sinus pressure/drainage, cough, sore throat x 3 days.  Patient has tried salt water w/o relief.

## 2021-11-27 NOTE — Discharge Instructions (Signed)
Drink lots of water Continue your usual allergy medicines, Allegra, Flonase, and Singulair Take antibiotic 2 times a day Call for problems

## 2021-11-27 NOTE — ED Provider Notes (Signed)
[facility]    CSN: [id number] Arrival date & time: 11/27/21  7510      History   Chief Complaint Chief Complaint  Patient presents with   Facial Pain    HPI Debra Brown is a 66 y.o. female.   HPI  Patient is here for sinus infection.  Is been going on for a week.  She states that she has had congestion and sinus pressure and drainage cough and sore throat.  She has purulent drainage.  She has underlying allergies.  Takes Allegra, Flonase, and Singulair daily.  She states that she is having a cough with some sputum production.  Chest feels tight.  Overall quite tired.  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   Elevated hemoglobin (HCC) 07/14/2021   PND (post-nasal drip) 07/13/2021   Sun-induced skin changes, keratosis 07/13/2021   Seborrheic keratoses 07/10/2020   Elevated LDL cholesterol level 07/07/2020   Osteopenia 01/22/2020   History of uterine cancer 01/10/2020   Post-menopausal 01/10/2020   LBBB (left bundle branch block) 03/10/2019   Endometrial carcinoma ([place]) 02/28/2019   Allergic conjunctivitis 08/02/2012   External hemorrhoids 08/02/2012   Hemangioma 08/02/2012   Preventive measure 08/02/2012   Hypothyroidism    Essential hypertension    HSV-1 (herpes simplex virus 1) infection     Past Surgical History:  Procedure Laterality Date   ABDOMINAL HYSTERECTOMY     CESAREAN SECTION     TONSILLECTOMY AND ADENOIDECTOMY     TUBAL LIGATION      OB History   No obstetric history on file.      Home Medications    Prior to Admission medications   Medication Sig Start Date End Date Taking? Authorizing Provider  amoxicillin (AMOXIL) 875 MG tablet Take 1 tablet (875 mg total) by mouth 2 (two) times daily. 11/27/21  Yes Raylene Everts, MD  Ascorbic Acid (VITAMIN C) 100 MG tablet Take 100 mg by mouth daily.   Yes [provider]  atorvastatin (LIPITOR) 20 MG tablet Take 1 tablet (20 mg total) by mouth daily. 07/14/21  Yes Breeback, Jade L, PA-C  CALCIUM PO Take 600 mg by mouth daily.   Yes [provider]  cholecalciferol (VITAMIN D3) 25 MCG (1000 UNIT) tablet Take 2,000 Units by mouth daily.   Yes [provider]  Cyanocobalamin (VITAMIN B 12 PO) Take by mouth. Once a day   Yes [provider]  fexofenadine (ALLEGRA) 180 MG tablet Take 180 mg by mouth daily.   Yes [provider]  fluticasone (FLONASE) 50 MCG/ACT nasal spray SPRAY 2 SPRAYS INTO EACH NOSTRIL EVERY DAY 10/12/21  Yes Breeback, Jade L, PA-C  levothyroxine (SYNTHROID) 125 MCG tablet Take 1 tablet (125 mcg total) by mouth daily. 07/13/21  Yes Breeback, Jade L, PA-C  lisinopril-hydrochlorothiazide (ZESTORETIC) 20-25 MG tablet Take 1 tablet by mouth daily. 07/13/21  Yes Breeback, Jade L, PA-C  Magnesium 250 MG TABS Take by mouth daily.   Yes [provider]  montelukast (SINGULAIR) 10 MG tablet Take 1 tablet (10 mg total) by mouth at bedtime. 07/13/21  Yes Breeback, Jade L, PA-C  Multiple Vitamins-Minerals (CENTRUM SILVER PO) Take by mouth daily.   Yes [provider]  Multiple Vitamins-Minerals (ZINC PO) Take 50 mg by mouth daily.   Yes [provider]  Probiotic Product (PROBIOTIC-10 PO) Take by mouth daily.   Yes [provider]  Propylene Glycol (SYSTANE BALANCE OP) Apply to Brown.   Yes [provider]  TURMERIC CURCUMIN PO Take by mouth daily. 450/10 mg   Yes [provider]    Family History Family History  Problem Relation Age of Onset   Hyperlipidemia Mother    Hypertension Mother    Heart attack Mother    Diabetes Mother    Heart attack Father    Heart attack Sister    Diabetes Sister    Heart attack Sister    Diabetes Sister    CAD Sister     Stroke Paternal Aunt     Social History Social History   Tobacco Use   Smoking status: Never   Smokeless tobacco: Never  Substance Use Topics   Alcohol use: Yes    Comment: occassionally   Drug use: No     Allergies   Patient has no known allergies.   Review of Systems Review of Systems See HPI  Physical Exam Triage Vital Signs ED Triage Vitals [11/27/21 0954]  Enc Vitals Group     BP 135/79     Pulse Rate 89     Resp 18     Temp 98.9 F (37.2 C)     Temp Source Oral     SpO2 96 %     Weight 145 lb (65.8 kg)     Height 5' 1.5" (1.562 m)     Head Circumference      Peak Flow      Pain Score 7     Pain Loc      Pain Edu?      Excl. in [place]?    No data found.  Updated Vital Signs BP 135/79 (BP Location: Right Arm)    Pulse 89    Temp 98.9 F (37.2 C) (Oral)    Resp 18    Ht 5' 1.5" (1.562 m)    Wt 65.8 kg    SpO2 96%    BMI 26.95 kg/m      Physical Exam Constitutional:      General: She is not in acute distress.    Appearance: She is well-developed.  HENT:     Head: Normocephalic and atraumatic.     Right Ear: Tympanic membrane and ear canal normal.     Left Ear: Tympanic membrane and ear canal normal.     Nose: Congestion and rhinorrhea present.     Comments: Maxillary and frontal sinuses are tender    Mouth/Throat:     Mouth: Mucous membranes are moist.     Pharynx: Posterior oropharyngeal erythema present.  Eyes:     Conjunctiva/sclera: Conjunctivae normal.     Pupils: Pupils are equal, round, and reactive to light.  Cardiovascular:     Rate and Rhythm: Normal rate.  Pulmonary:     Effort: Pulmonary effort is normal. No respiratory distress.     Breath sounds: Rhonchi present.     Comments: Few scattered wheeze Abdominal:     General: There is no distension.     Palpations: Abdomen is soft.  Musculoskeletal:        General: Normal range of motion.     Cervical back: Normal range of motion.  Lymphadenopathy:     Cervical: Cervical  adenopathy present.  Skin:    General:  Skin is warm and dry.  Neurological:     Mental Status: She is alert.  Psychiatric:        Mood and Affect: Mood normal.        Behavior: Behavior normal.     UC Treatments / Results  Labs (all labs ordered are listed, but only abnormal results are displayed) Labs Reviewed - No data to display  EKG   Radiology No results found.  Procedures Procedures (including critical care time)  Medications Ordered in UC Medications - No data to display  Initial Impression / Assessment and Plan / UC Course  I have reviewed the triage vital signs and the nursing notes.  Pertinent labs & imaging results that were available during my care of the patient were reviewed by me and considered in my medical decision making (see chart for details).     Final Clinical Impressions(s) / UC Diagnoses   Final diagnoses:  Acute non-recurrent maxillary sinusitis     Discharge Instructions      Drink lots of water Continue your usual allergy medicines, Allegra, Flonase, and Singulair Take antibiotic 2 times a day Call for problems   ED Prescriptions     Medication Sig Dispense Auth. Provider   amoxicillin (AMOXIL) 875 MG tablet Take 1 tablet (875 mg total) by mouth 2 (two) times daily. 14 tablet Raylene Everts, MD      PDMP not reviewed this encounter.   Raylene Everts, MD 11/27/21 1100

## 2022-01-05 ENCOUNTER — Other Ambulatory Visit: Payer: Self-pay | Admitting: Physician Assistant

## 2022-01-05 DIAGNOSIS — I10 Essential (primary) hypertension: Secondary | ICD-10-CM

## 2022-01-14 ENCOUNTER — Ambulatory Visit (INDEPENDENT_AMBULATORY_CARE_PROVIDER_SITE_OTHER): Payer: Medicare HMO | Admitting: Physician Assistant

## 2022-01-14 ENCOUNTER — Other Ambulatory Visit: Payer: Self-pay

## 2022-01-14 ENCOUNTER — Encounter: Payer: Self-pay | Admitting: Physician Assistant

## 2022-01-14 VITALS — BP 126/62 | HR 84 | Ht 61.5 in | Wt 139.0 lb

## 2022-01-14 DIAGNOSIS — D582 Other hemoglobinopathies: Secondary | ICD-10-CM | POA: Diagnosis not present

## 2022-01-14 DIAGNOSIS — R790 Abnormal level of blood mineral: Secondary | ICD-10-CM | POA: Diagnosis not present

## 2022-01-14 DIAGNOSIS — J40 Bronchitis, not specified as acute or chronic: Secondary | ICD-10-CM

## 2022-01-14 DIAGNOSIS — B001 Herpesviral vesicular dermatitis: Secondary | ICD-10-CM

## 2022-01-14 DIAGNOSIS — I10 Essential (primary) hypertension: Secondary | ICD-10-CM | POA: Diagnosis not present

## 2022-01-14 DIAGNOSIS — J329 Chronic sinusitis, unspecified: Secondary | ICD-10-CM | POA: Diagnosis not present

## 2022-01-14 MED ORDER — ACYCLOVIR 5 % EX OINT: 1.0000 "application " | TOPICAL_OINTMENT | CUTANEOUS | 1 refills | Status: DC

## 2022-01-14 MED ORDER — METHYLPREDNISOLONE 4 MG PO TBPK: ORAL_TABLET | ORAL | 0 refills | Status: DC

## 2022-01-14 MED ORDER — LISINOPRIL-HYDROCHLOROTHIAZIDE 20-25 MG PO TABS: 1.0000 | ORAL_TABLET | Freq: Every day | ORAL | 1 refills | Status: DC

## 2022-01-14 MED ORDER — DOXYCYCLINE HYCLATE 100 MG PO TABS: 100.0000 mg | ORAL_TABLET | Freq: Two times a day (BID) | ORAL | 0 refills | Status: DC

## 2022-01-14 NOTE — Progress Notes (Signed)
Acute Office Visit  Subjective:    Patient ID: Debra Brown, female    DOB: [birth year], 66 y.o.   MRN: [id number]  Chief Complaint  Patient presents with   Follow-up    HPI Patient is in today for refills.   Pt is doing overall well. No CP, palpitations, headaches. She is taking her medication as prescribed. She is not checking her BP at home.   She has had sinus pressure, congestion, cough for over a month. She got some better with augment but continues to have symptoms and now they are worsening. She has lots of seasonal allergies and seem to be worse earlier this year. Her chest does feel tight but no SOB. No fever, chills, body aches. She is taking all her allergy medications and sudafed.   Needs refills for cream for cold sores.   Needs labs recheck for elevated hemoglobin at last check. She did stop her iron tablet.   Past Medical History:  Diagnosis Date   HSV-1 (herpes simplex virus 1) infection    Hyperlipidemia    Thyroid disease    Uterine cancer ([place])     Past Surgical History:  Procedure Laterality Date   ABDOMINAL HYSTERECTOMY     CESAREAN SECTION     TONSILLECTOMY AND ADENOIDECTOMY     TUBAL LIGATION      Family History  Problem Relation Age of Onset   Hyperlipidemia Mother    Hypertension Mother    Heart attack Mother    Diabetes Mother    Heart attack Father    Heart attack Sister    Diabetes Sister    Heart attack Sister    Diabetes Sister    CAD Sister    Stroke Paternal 68     Social History   Socioeconomic History   Marital status: Married    Spouse name: Niki Payment   Number of children: 2   Years of education: 12   Highest education level: 12th grade  Occupational History   Occupation: Retired  Tobacco Use   Smoking status: Never   Smokeless tobacco: Never  Substance and Sexual Activity   Alcohol use: Yes    Comment: occassionally   Drug use: No   Sexual activity: Yes    Partners: Male  Other Topics Concern   Not on  file  Social History Narrative   Lives with her husband along with her son and 2 grandchildren. She walks 3 miles everyday.    Social Determinants of Health   Financial Resource Strain: Low Risk    Difficulty of Paying Living Expenses: Not hard at all  Food Insecurity: No Food Insecurity   Worried About Charity fundraiser in the Last Year: Never true   [place] in the Last Year: Never true  Transportation Needs: No Transportation Needs   Lack of Transportation (Medical): No   Lack of Transportation (Non-Medical): No  Physical Activity: Sufficiently Active   Days of Exercise per Week: 7 days   Minutes of Exercise per Session: 60 min  Stress: No Stress Concern Present   Feeling of Stress : Not at all  Social Connections: Moderately Isolated   Frequency of Communication with Friends and Family: More than three times a week   Frequency of Social Gatherings with Friends and Family: More than three times a week   Attends Religious Services: Never   Marine scientist or Organizations: No   Attends Archivist Meetings: Never   Marital  Status: Married  Human resources officer Violence: Not At Risk   Fear of Current or Ex-Partner: No   Emotionally Abused: No   Physically Abused: No   Sexually Abused: No    Outpatient Medications Prior to Visit  Medication Sig Dispense Refill   Ascorbic Acid (VITAMIN C) 100 MG tablet Take 100 mg by mouth daily.     atorvastatin (LIPITOR) 20 MG tablet Take 1 tablet (20 mg total) by mouth daily. 90 tablet 3   CALCIUM PO Take 600 mg by mouth daily.     cholecalciferol (VITAMIN D3) 25 MCG (1000 UNIT) tablet Take 2,000 Units by mouth daily.     Cyanocobalamin (VITAMIN B 12 PO) Take by mouth. Once a day     fexofenadine (ALLEGRA) 180 MG tablet Take 180 mg by mouth daily.     fluticasone (FLONASE) 50 MCG/ACT nasal spray SPRAY 2 SPRAYS INTO EACH NOSTRIL EVERY DAY 48 mL 1   levothyroxine (SYNTHROID) 125 MCG tablet Take 1 tablet (125 mcg total)  by mouth daily. 90 tablet 3   lisinopril-hydrochlorothiazide (ZESTORETIC) 20-25 MG tablet TAKE 1 TABLET BY MOUTH EVERY DAY 90 tablet 0   Magnesium 250 MG TABS Take by mouth daily.     montelukast (SINGULAIR) 10 MG tablet Take 1 tablet (10 mg total) by mouth at bedtime. 90 tablet 3   Multiple Vitamins-Minerals (CENTRUM SILVER PO) Take by mouth daily.     Multiple Vitamins-Minerals (ZINC PO) Take 50 mg by mouth daily.     Probiotic Product (PROBIOTIC-10 PO) Take by mouth daily.     Propylene Glycol (SYSTANE BALANCE OP) Apply to eye.     TURMERIC CURCUMIN PO Take by mouth daily. 450/10 mg     amoxicillin (AMOXIL) 875 MG tablet Take 1 tablet (875 mg total) by mouth 2 (two) times daily. 14 tablet 0   No facility-administered medications prior to visit.    No Known Allergies  Review of Systems    See HPI.  Objective:    Physical Exam Vitals reviewed.  Constitutional:      Appearance: Normal appearance. She is obese.  HENT:     Head: Normocephalic.     Right Ear: Tympanic membrane, ear canal and external ear normal. There is no impacted cerumen.     Left Ear: Tympanic membrane, ear canal and external ear normal. There is no impacted cerumen.     Nose: Congestion present.     Mouth/Throat:     Pharynx: Posterior oropharyngeal erythema present.  Eyes:     General:        Right eye: Discharge present.        Left eye: Discharge present.    Pupils: Pupils are equal, round, and reactive to light.     Comments: Watery discharge with some minor conjunctiva injection.   Neck:     Vascular: No carotid bruit.  Cardiovascular:     Rate and Rhythm: Normal rate and regular rhythm.  Pulmonary:     Effort: Pulmonary effort is normal.     Breath sounds: Normal breath sounds.  Musculoskeletal:     Cervical back: Normal range of motion and neck supple. No tenderness.     Right lower leg: No edema.     Left lower leg: No edema.  Lymphadenopathy:     Cervical: No cervical adenopathy.   Neurological:     General: No focal deficit present.     Mental Status: She is alert.  Psychiatric:  Mood and Affect: Mood normal.    BP 126/62    Pulse 84    Ht 5' 1.5" (1.562 m)    Wt 139 lb (63 kg)    SpO2 98%    BMI 25.84 kg/m  Wt Readings from Last 3 Encounters:  01/14/22 139 lb (63 kg)  11/27/21 145 lb (65.8 kg)  10/07/21 144 lb (65.3 kg)     Lab Results  Component Value Date   TSH 1.73 07/13/2021   Lab Results  Component Value Date   WBC 7.1 07/13/2021   HGB 17.0 (H) 07/13/2021   HCT 51.1 (H) 07/13/2021   MCV 86.6 07/13/2021   PLT 286 07/13/2021   Lab Results  Component Value Date   NA 140 07/13/2021   K 4.0 07/13/2021   CO2 30 07/13/2021   GLUCOSE 82 07/13/2021   BUN 15 07/13/2021   CREATININE 0.73 07/13/2021   BILITOT 0.5 07/13/2021   ALKPHOS 73 08/06/2012   AST 17 07/13/2021   ALT 16 07/13/2021   PROT 7.2 07/13/2021   ALBUMIN 4.1 08/06/2012   CALCIUM 9.7 07/13/2021   EGFR 90 07/13/2021   Lab Results  Component Value Date   CHOL 205 (H) 07/13/2021   Lab Results  Component Value Date   HDL 65 07/13/2021   Lab Results  Component Value Date   LDLCALC 123 (H) 07/13/2021   Lab Results  Component Value Date   TRIG 80 07/13/2021   Lab Results  Component Value Date   CHOLHDL 3.2 07/13/2021   No results found for: HGBA1C     Assessment & Plan:  .Marland KitchenJanalynn was seen today for follow-up.  Diagnoses and all orders for this visit:  Essential hypertension -     COMPLETE METABOLIC PANEL WITH GFR -     lisinopril-hydrochlorothiazide (ZESTORETIC) 20-25 MG tablet; Take 1 tablet by mouth daily.  Elevated hemoglobin (HCC) -     CBC with Differential/Platelet  Recurrent cold sores -     acyclovir ointment (ZOVIRAX) 5 %; Apply 1 application topically every 3 (three) hours.  Sinobronchitis -     doxycycline (VIBRA-TABS) 100 MG tablet; Take 1 tablet (100 mg total) by mouth 2 (two) times daily. -     methylPREDNISolone (MEDROL DOSEPAK) 4 MG  TBPK tablet; Take as directed by package insert.   Recheck labs.  She did stop iron tablets.  BP looks good today.  Check CMP.  Acyclovir ointment sent to pharmacy to treat cold sores.  Doxycycline and medrol dose pack sent for sinobronchitis Follow up as needed or if symptoms worsen.     Iran Planas, PA-C

## 2022-01-17 ENCOUNTER — Other Ambulatory Visit: Payer: Self-pay | Admitting: Neurology

## 2022-01-17 DIAGNOSIS — R7989 Other specified abnormal findings of blood chemistry: Secondary | ICD-10-CM

## 2022-01-17 NOTE — Progress Notes (Signed)
Please add peripheral smear and ferritin/serum iron to labs.   Sodium is down some. Recheck this week.

## 2022-01-19 LAB — COMPLETE METABOLIC PANEL WITH GFR
AG Ratio: 1.4 (calc) (ref 1.0–2.5)
ALT: 15 U/L (ref 6–29)
AST: 14 U/L (ref 10–35)
Albumin: 4.2 g/dL (ref 3.6–5.1)
Alkaline phosphatase (APISO): 86 U/L (ref 37–153)
BUN: 14 mg/dL (ref 7–25)
CO2: 28 mmol/L (ref 20–32)
Calcium: 9.9 mg/dL (ref 8.6–10.4)
Chloride: 97 mmol/L — ABNORMAL LOW (ref 98–110)
Creat: 0.72 mg/dL (ref 0.50–1.05)
Globulin: 3 g/dL (calc) (ref 1.9–3.7)
Glucose, Bld: 92 mg/dL (ref 65–99)
Potassium: 4.1 mmol/L (ref 3.5–5.3)
Sodium: 134 mmol/L — ABNORMAL LOW (ref 135–146)
Total Bilirubin: 0.7 mg/dL (ref 0.2–1.2)
Total Protein: 7.2 g/dL (ref 6.1–8.1)
eGFR: 92 mL/min/{1.73_m2} (ref >=60)

## 2022-01-19 LAB — CBC WITH DIFFERENTIAL/PLATELET
Absolute Monocytes: 502 cells/uL (ref 200–950)
Basophils Absolute: 40 cells/uL (ref 0–200)
Basophils Relative: 0.6 %
Eosinophils Absolute: 244 cells/uL (ref 15–500)
Eosinophils Relative: 3.7 %
HCT: 48 % — ABNORMAL HIGH (ref 35.0–45.0)
Hemoglobin: 16.1 g/dL — ABNORMAL HIGH (ref 11.7–15.5)
Lymphs Abs: 1729 cells/uL (ref 850–3900)
MCH: 28.8 pg (ref 27.0–33.0)
MCHC: 33.5 g/dL (ref 32.0–36.0)
MCV: 85.7 fL (ref 80.0–100.0)
MPV: 10.1 fL (ref 7.5–12.5)
Monocytes Relative: 7.6 %
Neutro Abs: 4085 cells/uL (ref 1500–7800)
Neutrophils Relative %: 61.9 %
Platelets: 249 10*3/uL (ref 140–400)
RBC: 5.6 10*6/uL — ABNORMAL HIGH (ref 3.80–5.10)
RDW: 12.2 % (ref 11.0–15.0)
Total Lymphocyte: 26.2 %
WBC: 6.6 10*3/uL (ref 3.8–10.8)

## 2022-01-19 LAB — IRON,TIBC AND FERRITIN PANEL
%SAT: 31 % (calc) (ref 16–45)
Ferritin: 42 ng/mL (ref 16–288)
Iron: 113 ug/dL (ref 45–160)
TIBC: 364 mcg/dL (calc) (ref 250–450)

## 2022-01-19 NOTE — Progress Notes (Signed)
Serum iron in normal range and so is ferritin. Will continue to watch hemoglobin.

## 2022-01-20 DIAGNOSIS — R7989 Other specified abnormal findings of blood chemistry: Secondary | ICD-10-CM | POA: Diagnosis not present

## 2022-01-20 LAB — SODIUM: Sodium: 137 mmol/L (ref 135–146)

## 2022-01-21 NOTE — Progress Notes (Signed)
Normal sodium.

## 2022-04-04 ENCOUNTER — Other Ambulatory Visit: Payer: Self-pay | Admitting: Physician Assistant

## 2022-04-04 DIAGNOSIS — R0982 Postnasal drip: Secondary | ICD-10-CM

## 2022-05-02 ENCOUNTER — Ambulatory Visit (INDEPENDENT_AMBULATORY_CARE_PROVIDER_SITE_OTHER): Payer: Medicare HMO | Admitting: Physician Assistant

## 2022-05-02 VITALS — BP 140/48 | HR 82 | Ht 61.5 in | Wt 145.0 lb

## 2022-05-02 DIAGNOSIS — M19041 Primary osteoarthritis, right hand: Secondary | ICD-10-CM | POA: Insufficient documentation

## 2022-05-02 DIAGNOSIS — M7061 Trochanteric bursitis, right hip: Secondary | ICD-10-CM

## 2022-05-02 DIAGNOSIS — M19042 Primary osteoarthritis, left hand: Secondary | ICD-10-CM | POA: Diagnosis not present

## 2022-05-02 NOTE — Patient Instructions (Addendum)
Paraffin hand wax treatment for arthritis  Preventing Osteoarthritis, Adult Osteoarthritis is a type of arthritis that affects tissue that covers the ends of bones in joints (cartilage). Cartilage acts as a cushion between the bones and helps them move smoothly. Osteoarthritis results when cartilage in the joints gets worn down. This causes the joint to break down over time. Osteoarthritis cannot always be prevented, but you can take steps to lower your risk of developing this condition. Once you have osteoarthritis, it does not go away, so lowering your risk is important. How can this condition affect me? Osteoarthritis can affect any joint and can make movement painful. It usually affects the joints of the hands, spine, hips, knees, or toes. The knee joints are most commonly affected. The condition can cause symptoms such as: Pain, tenderness, and stiffness in a joint, especially first thing in the morning or after a period of resting. Trouble moving the affected joint. A grating or scraping feeling inside the joint when using it. This condition can make it harder to do things that you need or want to do each day. Osteoarthritis in a major joint, such as your knee or hip, can make it painful to walk or exercise. If you develop osteoarthritis in your hands, you might not be able to grip items, twist your hand, or control small movements of your hands and fingers. Osteoarthritis is a degenerative disease. This means that it may get worse over time. What can increase my risk? Some factors may make you more likely to develop osteoarthritis, such as: Being 68 years of age or older. Being overweight. Doing activities that put extra stress on your joints. Injuring or overusing a joint. Having previous surgery on a joint. Having a family history of osteoarthritis. Some risk factors, such as your age and family history, cannot be changed. But you can take steps to control other risk factors and lower  your chances of developing osteoarthritis. What actions can I take to prevent this condition? Activity     Stay active. Doing moderate exercise for 30 minutes, 5 times a week will help you maintain a healthy weight and strengthen the muscles that support your joints. Good exercises for preventing osteoarthritis and protecting your joints include low-impact activities, such as walking, swimming, hiking, yoga, and biking. Avoid doing too many high-impact exercises and activities that put extra stress on joints. These include activities that involve heavy lifting, gripping, bending, kneeling, and squatting. Osteoarthritis is much more common in people with jobs that require these activities. If your job involves this type of activity, learn ways to reduce the stress on your joints, such as: Using proper lifting techniques. Taking breaks as needed. Consider working with a Music therapist when starting a new physical activity or sport. A coach or trainer can help you: Learn proper techniques for the activity. Come up with a training plan that is safe and effective for you. Get treatment for any joint injury. After an injury, do not return to full activity until you have been cleared by your health care provider. An injured joint that does not heal properly is much more likely to develop osteoarthritis. Maintain good posture while standing and sitting. This reduces stress on the joints of your back, neck, and hips. When standing, keep your upper back and neck straight, with your shoulders pulled back. Avoid slouching. When sitting, keep your back straight and relax your shoulders. Do not round your shoulders or pull them backward. Lifestyle Lose weight if you are  overweight. More weight puts more stress on your joints. Control your blood sugar. Diabetes may increase your risk of osteoarthritis. Have your blood sugar checked to make sure you are not at risk of developing diabetes. If you already have  diabetes, work closely with your health care provider to keep your blood sugar at a healthy level. Do not wear high heels. These put stress on your toes, knees, and hips. Do not use any products that contain nicotine or tobacco, such as cigarettes, e-cigarettes, and chewing tobacco. If you need help quitting, ask your health care provider. Nutrition  Eat a healthy diet that includes foods that are high in vitamin C, vitamin D, and omega-3 fatty acids. Get vitamin C from fruits and vegetables. Get vitamin D from fish, eggs, and dairy products. Some foods have vitamin D added to them (are fortified). Look for fortified foods like milk, orange juice, and cereals. Spending some time in the sun also increases vitamin D. Get omega-3 from cold-water fish, such as cod, mackerel, and sardines. Other sources include: Fortified eggs. Soybeans. Nuts, such as walnuts. Flax, chia, and hemp seeds. Check with your health care provider before taking an over-the-counter supplement for bone or joint health, especially if you are taking any medicines. Supplements may interact with some medicines. Also, evidence that taking a supplement will reduce your risk of osteoarthritis is not strong. Where to find more information Arthritis Foundation: www.arthritis.[place] of Rheumatology: www.rheumatology.[facility] of Arthritis and Musculoskeletal and Skin Diseases: www.niams.[employer].es Contact a health care provider if: You have pain or swelling in a joint. You have joint stiffness or you lose full motion at the joint. Your joint becomes large and knobby. You have cracking or grinding in a joint. You have a joint injury. Summary Osteoarthritis involves the loss of tissue that covers the ends of bones in joints (cartilage). It can affect any joint and can make movement painful. Osteoarthritis is most common in middle and older age. Osteoarthritis cannot always be prevented, but you can take  steps to lower your risk of developing this condition. Eating a healthy diet, losing weight, and staying active may lower your risk of osteoarthritis. Let your health care provider know if you have a joint injury or joint pain. This information is not intended to replace advice given to you by your health care provider. Make sure you discuss any questions you have with your health care provider. Document Revised: 05/20/2019 Document Reviewed: 02/12/2019 Elsevier Patient Education  [place]. Hip Bursitis Rehab Ask your health care provider which exercises are safe for you. Do exercises exactly as told by your health care provider and adjust them as directed. It is normal to feel mild stretching, pulling, tightness, or discomfort as you do these exercises. Stop right away if you feel sudden pain or your pain gets worse. Do not begin these exercises until told by your health care provider. Stretching exercise This exercise warms up your muscles and joints and improves the movement and flexibility of your hip. This exercise also helps to relieve pain and stiffness. Iliotibial band stretch An iliotibial band is a strong band of muscle tissue that runs from the outer side of your hip to the outer side of your thigh and knee. Lie on your side with your left / right leg in the top position. Bend your left / right knee and grab your ankle. Stretch out your bottom arm to help you balance. Slowly bring your knee back so your thigh  is slightly behind your body. Slowly lower your knee toward the floor until you feel a gentle stretch on the outside of your left / right thigh. If you do not feel a stretch and your knee will not lower more toward the floor, place the heel of your other foot on top of your knee and pull your knee down toward the floor with your foot. Hold this position for __________ seconds. Slowly return to the starting position. Repeat __________ times. Complete this exercise  __________ times a day. Strengthening exercises These exercises build strength and endurance in your hip and pelvis. Endurance is the ability to use your muscles for a long time, even after they get tired. Bridge This exercise strengthens the muscles that move your thigh backward (hip extensors). Lie on your back on a firm surface with your knees bent and your feet flat on the floor. Tighten your buttocks muscles and lift your buttocks off the floor until your trunk is level with your thighs. Do not arch your back. You should feel the muscles working in your buttocks and the back of your thighs. If you do not feel these muscles, slide your feet 1-2 inches (2.5-5 cm) farther away from your buttocks. If this exercise is too easy, try doing it with your arms crossed over your chest. Hold this position for __________ seconds. Slowly lower your hips to the starting position. Let your muscles relax completely after each repetition. Repeat __________ times. Complete this exercise __________ times a day. Squats This exercise strengthens the muscles in front of your thigh and knee (quadriceps). Stand in front of a table, with your feet and knees pointing straight ahead. You may rest your hands on the table for balance but not for support. Slowly bend your knees and lower your hips like you are going to sit in a chair. Keep your weight over your heels, not over your toes. Keep your lower legs upright so they are parallel with the table legs. Do not let your hips go lower than your knees. Do not bend lower than told by your health care provider. If your hip pain increases, do not bend as low. Hold the squat position for __________ seconds. Slowly push with your legs to return to standing. Do not use your hands to pull yourself to standing. Repeat __________ times. Complete this exercise __________ times a day. Hip hike  Stand sideways on a bottom step. Stand on your left / right leg with your other  foot unsupported next to the step. You can hold on to the railing or wall for balance if needed. Keep your knees straight and your torso square. Then lift your left / right hip up toward the ceiling. Hold this position for __________ seconds. Slowly let your left / right hip lower toward the floor, past the starting position. Your foot should get closer to the floor. Do not lean or bend your knees. Repeat __________ times. Complete this exercise __________ times a day. Single leg stand This exercise increases your balance. Without shoes, stand near a railing or in a doorway. You may hold on to the railing or door frame as needed for balance. Squeeze your left / right buttock muscles, then lift up your other foot. Do not let your left / right hip push out to the side. It is helpful to stand in front of a mirror for this exercise so you can watch your hip. Hold this position for __________ seconds. Repeat __________ times. Complete this exercise  __________ times a day. This information is not intended to replace advice given to you by your health care provider. Make sure you discuss any questions you have with your health care provider. Document Revised: 10/20/2021 Document Reviewed: 10/20/2021 Elsevier Patient Education  [place]. Hip Bursitis  Hip bursitis is the swelling of one or more of the fluid-filled sacs (bursae) in the hip joint. The hip bursae absorb shocks and prevent bones from rubbing against each other. If a bursa becomes irritated, it can fill with extra fluid and become inflamed. Hip bursitis can cause mild to moderate pain, and symptoms often come and go over time. What are the causes? This condition results from increased friction between the hip bones and the tendons around the hip joint. This condition can happen if you: Overuse your hip muscles. Injure your hip. Have weak buttocks muscles. Have bone spurs. Have an infection. In some cases, the cause may not be  known. What increases the risk? You are more likely to develop this condition if: You injured your hip previously or had hip surgery. You have a medical condition, such as arthritis, gout, diabetes, or thyroid disease. You have spine problems. You have one leg that is shorter than the other. You participate in athletic activities that include repetitive motion, like running. You participate in sports where there is a risk of injury or falling, such as football, martial arts, or skiing. What are the signs or symptoms? Symptoms may come and go, and they often include: Pain in the hip or groin area. Pain may get worse with movement. Tenderness and swelling of the hip. In rare cases, the bursa may become infected. If this happens, you may get a fever, as well as warmth and redness in the hip area. How is this diagnosed? This condition may be diagnosed based on: Your symptoms. Your medical history. A physical exam. Imaging tests, such as: X-rays to check your bones. MRI or ultrasound to check your tendons and muscles. Bone scan. How is this treated? This condition is treated by resting, icing, applying pressure (compression), and raising (elevating) the injured area. This is called RICE treatment. In some cases, RICE treatment may not be enough to make your symptoms go away. Treatment may also include: Using crutches, a cane, or a walker to decrease the strain on your hip. Taking medicine to help with swelling and pain. Getting a shot of cortisone medicine near the affected area to reduce swelling and pain. Taking antibiotic medicines if there is an infection. Draining fluid out of the bursa to help relieve swelling and pain. Having surgery to remove a damaged or infected bursa. This is rare. Long-term treatment may include: Physical therapy exercises for strength and flexibility. Identifying the cause of your bursitis to prevent future episodes. Lifestyle changes, such as weight loss,  to reduce the strain on the hip. Follow these instructions at home: Managing pain, stiffness, and swelling     If directed, put ice on the affected area. To do this: Put ice in a plastic bag. Place a towel between your skin and the bag. Leave the ice on for 20 minutes, 2-3 times a day. Remove the ice if your skin turns bright red. This is very important. If you cannot feel pain, heat, or cold, you have a greater risk of damage to the area. Elevate your hip as much as you can without feeling pain. To do this, put a pillow under your hips while you lie down. If directed, apply  heat to the affected area as often as told by your health care provider. Use the heat source that your health care provider recommends, such as a moist heat pack or a heating pad. Place a towel between your skin and the heat source. Leave the heat on for 20-30 minutes. Remove the heat if your skin turns bright red. This is especially important if you are unable to feel pain, heat, or cold. You may have a greater risk of getting burned. Activity Do not use your hip to support your body weight until your health care provider says that you can. Use crutches, a cane, or a walker as told by your health care provider. If the affected leg is one that you use to drive, ask your health care provider if it is safe to drive. Rest and protect your hip as much as possible until your pain and swelling get better. Return to your normal activities as told by your health care provider. Ask your health care provider what activities are safe for you. Do exercises as told by your health care provider. General instructions Take over-the-counter and prescription medicines only as told by your health care provider. Gently massage and stretch your injured area as often as is comfortable. Wear compression wraps only as told by your health care provider. If one of your legs is shorter than the other, get fitted for a shoe insert or orthotic.  Your health care provider or physical therapist can tell you where to find these items and what size you need. Maintain a healthy weight. Follow instructions from your health care provider for weight control. These may include dietary restrictions. Keep all follow-up visits. This is important. How is this prevented? Exercise regularly or as told by your health care provider. Wear supportive footwear that is appropriate for your sport and daily activities. Warm up and stretch before being active. Cool down and stretch after being active. Take breaks regularly from repetitive activity. If an activity irritates your hip or causes pain, avoid the activity as much as possible. Avoid sitting down for long periods at a time. Where to find more information American Academy of Orthopaedic Surgeons: orthoinfo.aaos.org Contact a health care provider if: You have a fever. You develop new symptoms. You have trouble walking or doing everyday activities. You have pain that gets worse or does not get better with medicine. You develop red skin or a feeling of warmth in your hip area. Get help right away if: You cannot move your hip. You have severe pain. You cannot control the muscles in your feet. Summary Hip bursitis is the swelling of one or more of the fluid-filled sacs (bursae) in the hip joint. Hip bursitis can cause hip or groin pain, and symptoms often come and go over time. This condition is often treated by resting, icing, applying pressure (compression), and raising (elevating) the injured area. Other treatments may be needed. This information is not intended to replace advice given to you by your health care provider. Make sure you discuss any questions you have with your health care provider. Document Revised: 11/02/2021 Document Reviewed: 11/02/2021 Elsevier Patient Education  [place].

## 2022-05-02 NOTE — Progress Notes (Signed)
Acute Office Visit  Subjective:     Patient ID: Sema Stangler, female    DOB: Dec 14, 1955, 66 y.o.   MRN: [id number]  Chief Complaint  Patient presents with   Leg Pain    Leg Pain    Patient is in today for evaluation of right hip pain for 1 week. She reports the pain is localized to the right greater trochanter, with some radiation into the ventral side of the right thigh. Gabbie reports that the pain in her hip is worse with palpation and is keeping her awake at night. She notes that it does not hurt more with exercise. She walks frequently, but has not increased or changed the amount she has walked. She has tried using heat and ibuprofen which has alleviated some of the pain. She is also endorsing generalized bilateral hand pain with insidious onset. No numbness or tingling reported, but the pain does cause her some weakness with grip. Denies any fevers, chills, nausea, vomiting, recent illness, trauma.  Review of Systems  Musculoskeletal:  Positive for joint pain.  All other systems reviewed and are negative.       Objective:    BP (!) 140/48   Pulse 82   Ht 5' 1.5" (1.562 m)   Wt 145 lb (65.8 kg)   SpO2 99%   BMI 26.95 kg/m  BP Readings from Last 3 Encounters:  05/02/22 (!) 140/48  01/14/22 126/62  11/27/21 135/79      Physical Exam Vitals reviewed.  Constitutional:      General: She is not in acute distress.    Appearance: Normal appearance. She is not ill-appearing.  Eyes:     Extraocular Movements: Extraocular movements intact.     Conjunctiva/sclera: Conjunctivae normal.  Cardiovascular:     Rate and Rhythm: Normal rate and regular rhythm.     Pulses: Normal pulses.     Heart sounds: Normal heart sounds.  Pulmonary:     Effort: Pulmonary effort is normal.     Breath sounds: Normal breath sounds.  Musculoskeletal:        General: Tenderness (Point tenderness noted with palpation over the right greater trochanter. Bilateral hands are not overtly tender  to palpation.) present. No swelling, deformity or signs of injury. Normal range of motion.     Right hip: Tenderness present. No crepitus. Normal range of motion. Normal strength.     Left hip: Normal.     Right lower leg: No edema.     Left lower leg: No edema.  Skin:    General: Skin is warm and dry.     Findings: No bruising or erythema.  Neurological:     Mental Status: She is alert and oriented to person, place, and time.     Sensory: No sensory deficit.     Motor: No weakness.  Psychiatric:        Mood and Affect: Mood normal.        Behavior: Behavior normal.    Aspiration/Injection Procedure Note Adelyna Brockman [id number] 1956/05/21  Procedure: Injection Indications: pain from bursitis   Procedure Details Consent: Risks of procedure as well as the alternatives and risks of each were explained to the (patient/caregiver).  Consent for procedure obtained. Time Out: Verified patient identification, verified procedure, site/side was marked, verified correct patient position, special equipment/implants available, medications/allergies/relevent history reviewed, required imaging and test results available.  Performed   Injection site cleaned with chlorhexidine Injected '40mg'$  of depo medrol and 63m of lidocaine with 22 gage  needled into right greater trochanter   A sterile dressing was applied.  Patient did tolerate procedure well. Estimated blood loss: none  Iran Planas 05/02/2022,        Assessment & Plan:   Rhylee was seen today for leg pain.  Diagnoses and all orders for this visit:  Greater trochanteric bursitis of right hip  Primary osteoarthritis of both hands   Solumedrol '40mg'$  administered into the right trochanteric bursa.  Discussed bursitis and HO given with exercises to start Continue ibuprofen as needed  Follow up if no improvement in the next week with sports medicine Dr. Darene Lamer.   Voltaren gel sample given for bilateral hand arthritis.NSAIDs as  needed.  Reccommended paraffin wax bath for help with hand arthritis. Dr. Darene Lamer for further management as needed.   Return if symptoms worsen or fail to improve.  Iran Planas, PA-C

## 2022-05-06 DIAGNOSIS — Z1211 Encounter for screening for malignant neoplasm of colon: Secondary | ICD-10-CM | POA: Diagnosis not present

## 2022-06-24 ENCOUNTER — Other Ambulatory Visit: Payer: Self-pay | Admitting: Physician Assistant

## 2022-06-24 DIAGNOSIS — Z1231 Encounter for screening mammogram for malignant neoplasm of breast: Secondary | ICD-10-CM

## 2022-06-24 DIAGNOSIS — Z1272 Encounter for screening for malignant neoplasm of vagina: Secondary | ICD-10-CM | POA: Diagnosis not present

## 2022-06-24 DIAGNOSIS — Z90710 Acquired absence of both cervix and uterus: Secondary | ICD-10-CM | POA: Diagnosis not present

## 2022-06-24 DIAGNOSIS — Z90721 Acquired absence of ovaries, unilateral: Secondary | ICD-10-CM | POA: Diagnosis not present

## 2022-06-24 DIAGNOSIS — Z01419 Encounter for gynecological examination (general) (routine) without abnormal findings: Secondary | ICD-10-CM | POA: Diagnosis not present

## 2022-06-24 DIAGNOSIS — Z8542 Personal history of malignant neoplasm of other parts of uterus: Secondary | ICD-10-CM | POA: Diagnosis not present

## 2022-06-30 ENCOUNTER — Other Ambulatory Visit: Payer: Self-pay | Admitting: Physician Assistant

## 2022-06-30 DIAGNOSIS — R0982 Postnasal drip: Secondary | ICD-10-CM

## 2022-07-13 ENCOUNTER — Ambulatory Visit: Payer: Medicare HMO | Admitting: Physician Assistant

## 2022-07-13 ENCOUNTER — Ambulatory Visit (INDEPENDENT_AMBULATORY_CARE_PROVIDER_SITE_OTHER): Payer: Medicare HMO | Admitting: Physician Assistant

## 2022-07-13 ENCOUNTER — Encounter: Payer: Self-pay | Admitting: General Practice

## 2022-07-13 ENCOUNTER — Encounter: Payer: Self-pay | Admitting: Physician Assistant

## 2022-07-13 VITALS — BP 138/72 | HR 66 | Ht 61.0 in | Wt 148.0 lb

## 2022-07-13 DIAGNOSIS — I10 Essential (primary) hypertension: Secondary | ICD-10-CM | POA: Diagnosis not present

## 2022-07-13 DIAGNOSIS — E7800 Pure hypercholesterolemia, unspecified: Secondary | ICD-10-CM | POA: Diagnosis not present

## 2022-07-13 DIAGNOSIS — Z79899 Other long term (current) drug therapy: Secondary | ICD-10-CM | POA: Diagnosis not present

## 2022-07-13 DIAGNOSIS — D582 Other hemoglobinopathies: Secondary | ICD-10-CM | POA: Diagnosis not present

## 2022-07-13 DIAGNOSIS — E039 Hypothyroidism, unspecified: Secondary | ICD-10-CM

## 2022-07-13 MED ORDER — LISINOPRIL-HYDROCHLOROTHIAZIDE 20-25 MG PO TABS: 1.0000 | ORAL_TABLET | Freq: Every day | ORAL | 1 refills | Status: DC

## 2022-07-13 NOTE — Progress Notes (Signed)
Established Patient Office Visit  Subjective   Patient ID: Debra Brown, female    DOB: Sep 16, 1956  Age: 66 y.o. MRN: [id number]  Chief Complaint  Patient presents with   Annual Exam    HPI Pt is a 66 yo female with HTN, HLD, hypothyroidism who presents to the clinic for 6 month follow up.   Pt is doing well. No problems or concerns. She is walking and staying very active. She denies any CP, palpitations, headaches, SOB, edema. She is sleeping well and mood is good. She does need refills on medication.   .. Active Ambulatory Problems    Diagnosis Date Noted   Hypothyroidism    Essential hypertension    HSV-1 (herpes simplex virus 1) infection    Allergic conjunctivitis 08/02/2012   External hemorrhoids 08/02/2012   Hemangioma 08/02/2012   Preventive measure 08/02/2012   History of uterine cancer 01/10/2020   Post-menopausal 01/10/2020   Osteopenia 01/22/2020   LBBB (left bundle branch block) 03/10/2019   Endometrial carcinoma (HCC) 02/28/2019   Elevated LDL cholesterol level 07/07/2020   Seborrheic keratoses 07/10/2020   PND (post-nasal drip) 07/13/2021   Sun-induced skin changes, keratosis 07/13/2021   Elevated hemoglobin (HCC) 07/14/2021   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  Objective:     BP 138/72   Pulse 66   Ht '5\' 1"'$  (1.549 m)   Wt 148 lb (67.1 kg)   SpO2 99%   BMI 27.96 kg/m  BP Readings from Last 3 Encounters:  07/13/22 138/72  05/02/22 (!) 140/48  01/14/22 126/62   Wt Readings from Last 3 Encounters:  07/13/22 148 lb (67.1 kg)  05/02/22 145 lb (65.8 kg)  01/14/22 139 lb (63 kg)      Physical Exam Vitals reviewed.  Constitutional:       Appearance: Normal appearance.  HENT:     Head: Normocephalic.  Cardiovascular:     Rate and Rhythm: Normal rate and regular rhythm.     Pulses: Normal pulses.  Pulmonary:     Effort: Pulmonary effort is normal.     Breath sounds: Normal breath sounds.  Musculoskeletal:     Right lower leg: No edema.     Left lower leg: No edema.  Neurological:     General: No focal deficit present.     Mental Status: She is alert and oriented to person, place, and time.  Psychiatric:        Mood and Affect: Mood normal.        Behavior: Behavior normal.         Assessment & Plan:  .Marland KitchenElma was seen today for annual exam.  Diagnoses and all orders for this visit:  Elevated hemoglobin (HCC) -     CBC with Differential/Platelet  Essential hypertension -     lisinopril-hydrochlorothiazide (ZESTORETIC) 20-25 MG tablet; Take 1 tablet by mouth daily.  Hypothyroidism, unspecified type -     TSH  Elevated LDL cholesterol level -     Lipid Panel w/reflex Direct LDL  Medication management -     COMPLETE METABOLIC PANEL WITH GFR   Labs ordered for medication refills and adjustments Will refill  accordingly BP much better on 2nd recheck   Return in about 6 months (around 01/13/2023).    Iran Planas, PA-C

## 2022-07-14 LAB — TSH: TSH: 0.92 mIU/L (ref 0.40–4.50)

## 2022-07-14 LAB — LIPID PANEL W/REFLEX DIRECT LDL
Cholesterol: 135 mg/dL (ref <200)
HDL: 64 mg/dL (ref >=50)
LDL Cholesterol (Calc): 57 mg/dL (calc)
Non-HDL Cholesterol (Calc): 71 mg/dL (calc) (ref <130)
Total CHOL/HDL Ratio: 2.1 (calc) (ref <5.0)
Triglycerides: 61 mg/dL (ref <150)

## 2022-07-14 LAB — CBC WITH DIFFERENTIAL/PLATELET
Absolute Monocytes: 510 cells/uL (ref 200–950)
Basophils Absolute: 53 cells/uL (ref 0–200)
Basophils Relative: 0.7 %
Eosinophils Absolute: 300 cells/uL (ref 15–500)
Eosinophils Relative: 4 %
HCT: 47.1 % — ABNORMAL HIGH (ref 35.0–45.0)
Hemoglobin: 15.7 g/dL — ABNORMAL HIGH (ref 11.7–15.5)
Lymphs Abs: 1703 cells/uL (ref 850–3900)
MCH: 28.8 pg (ref 27.0–33.0)
MCHC: 33.3 g/dL (ref 32.0–36.0)
MCV: 86.3 fL (ref 80.0–100.0)
MPV: 10.2 fL (ref 7.5–12.5)
Monocytes Relative: 6.8 %
Neutro Abs: 4935 cells/uL (ref 1500–7800)
Neutrophils Relative %: 65.8 %
Platelets: 262 10*3/uL (ref 140–400)
RBC: 5.46 10*6/uL — ABNORMAL HIGH (ref 3.80–5.10)
RDW: 13 % (ref 11.0–15.0)
Total Lymphocyte: 22.7 %
WBC: 7.5 10*3/uL (ref 3.8–10.8)

## 2022-07-14 LAB — COMPLETE METABOLIC PANEL WITH GFR
AG Ratio: 1.5 (calc) (ref 1.0–2.5)
ALT: 16 U/L (ref 6–29)
AST: 18 U/L (ref 10–35)
Albumin: 4.2 g/dL (ref 3.6–5.1)
Alkaline phosphatase (APISO): 92 U/L (ref 37–153)
BUN: 19 mg/dL (ref 7–25)
CO2: 27 mmol/L (ref 20–32)
Calcium: 9.6 mg/dL (ref 8.6–10.4)
Chloride: 101 mmol/L (ref 98–110)
Creat: 0.74 mg/dL (ref 0.50–1.05)
Globulin: 2.8 g/dL (calc) (ref 1.9–3.7)
Glucose, Bld: 90 mg/dL (ref 65–99)
Potassium: 4.1 mmol/L (ref 3.5–5.3)
Sodium: 139 mmol/L (ref 135–146)
Total Bilirubin: 0.5 mg/dL (ref 0.2–1.2)
Total Protein: 7 g/dL (ref 6.1–8.1)
eGFR: 88 mL/min/{1.73_m2} (ref >=60)

## 2022-07-14 NOTE — Progress Notes (Signed)
Debra Brown,   Thyroid looks great.  Cholesterol looks amazing.  Kidney, liver, glucose looks good.  RBC/hemoglobin/hematocrit on the high side but better than previous years.  You should consider giving blood regularly it will help keep the production down.

## 2022-08-02 DIAGNOSIS — E890 Postprocedural hypothyroidism: Secondary | ICD-10-CM | POA: Diagnosis not present

## 2022-08-11 ENCOUNTER — Ambulatory Visit (INDEPENDENT_AMBULATORY_CARE_PROVIDER_SITE_OTHER): Payer: Medicare HMO

## 2022-08-11 DIAGNOSIS — Z1231 Encounter for screening mammogram for malignant neoplasm of breast: Secondary | ICD-10-CM | POA: Diagnosis not present

## 2022-08-12 NOTE — Progress Notes (Signed)
Normal mammogram. Follow up in 1 year.

## 2022-09-26 ENCOUNTER — Other Ambulatory Visit: Payer: Self-pay | Admitting: Physician Assistant

## 2022-09-27 ENCOUNTER — Other Ambulatory Visit: Payer: Self-pay | Admitting: Physician Assistant

## 2022-09-27 DIAGNOSIS — R0982 Postnasal drip: Secondary | ICD-10-CM

## 2022-10-27 DIAGNOSIS — E0500 Thyrotoxicosis with diffuse goiter without thyrotoxic crisis or storm: Secondary | ICD-10-CM | POA: Diagnosis not present

## 2022-10-27 DIAGNOSIS — E890 Postprocedural hypothyroidism: Secondary | ICD-10-CM | POA: Diagnosis not present

## 2022-10-31 ENCOUNTER — Encounter: Payer: Self-pay | Admitting: Physician Assistant

## 2022-10-31 ENCOUNTER — Ambulatory Visit (INDEPENDENT_AMBULATORY_CARE_PROVIDER_SITE_OTHER): Payer: Medicare HMO | Admitting: Physician Assistant

## 2022-10-31 VITALS — BP 144/80 | HR 67 | Temp 98.0°F | Ht 61.0 in | Wt 152.0 lb

## 2022-10-31 DIAGNOSIS — J0140 Acute pansinusitis, unspecified: Secondary | ICD-10-CM

## 2022-10-31 MED ORDER — AMOXICILLIN-POT CLAVULANATE 875-125 MG PO TABS: 1.0000 | ORAL_TABLET | Freq: Two times a day (BID) | ORAL | 0 refills | Status: DC

## 2022-10-31 NOTE — Progress Notes (Signed)
Acute Office Visit  Subjective:     Patient ID: Debra Brown, female    DOB: May 12, 1956, 66 y.o.   MRN: [id number]  Chief Complaint  Patient presents with   Sinus Problem    HPI Patient is in today for 3 weeks of URI symptoms of cough, congestion, sinus pressure, ear popping, sinus drainage. She has been using OTC tylenol cold sinus severe with minimal relief. Hx of recurrent sinusitis. No fever, chills, body aches. Her husband has had similar symptoms.   .. Active Ambulatory Problems    Diagnosis Date Noted   Hypothyroidism    Essential hypertension    HSV-1 (herpes simplex virus 1) infection    Allergic conjunctivitis 08/02/2012   External hemorrhoids 08/02/2012   Hemangioma 08/02/2012   Preventive measure 08/02/2012   History of uterine cancer 01/10/2020   Post-menopausal 01/10/2020   Osteopenia 01/22/2020   LBBB (left bundle branch block) 03/10/2019   Endometrial carcinoma (HCC) 02/28/2019   Elevated LDL cholesterol level 07/07/2020   Seborrheic keratoses 07/10/2020   PND (post-nasal drip) 07/13/2021   Sun-induced skin changes, keratosis 07/13/2021   Elevated hemoglobin (HCC) 07/14/2021   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     Objective:    BP (!) 144/80   Pulse 67   Temp 98 F (36.7 C) (Oral)   Ht '5\' 1"'$  (1.549 m)   Wt 152 lb (68.9 kg)   SpO2 98%   BMI 28.72 kg/m  BP Readings from Last 3 Encounters:  10/31/22 (!) 144/80  07/13/22 138/72  05/02/22 (!) 140/48   Wt Readings from Last 3 Encounters:  10/31/22 152 lb (68.9 kg)  07/13/22 148 lb (67.1 kg)  05/02/22 145 lb (65.8 kg)      Physical Exam Constitutional:      Appearance: Normal  appearance.  HENT:     Head: Normocephalic.     Comments: Sinus tenderness to palpation.     Right Ear: Tympanic membrane, ear canal and external ear normal. There is no impacted cerumen.     Left Ear: Tympanic membrane, ear canal and external ear normal. There is no impacted cerumen.     Nose: Congestion present.     Comments: Swollen turbinates.     Mouth/Throat:     Mouth: Mucous membranes are moist.     Pharynx: Posterior oropharyngeal erythema present. No oropharyngeal exudate.  Eyes:     Conjunctiva/sclera: Conjunctivae normal.  Neck:     Vascular: No carotid bruit.  Cardiovascular:     Rate and Rhythm: Normal rate and regular rhythm.     Pulses: Normal pulses.  Pulmonary:     Effort: Pulmonary effort is normal.  Musculoskeletal:     Cervical back: No tenderness.  Lymphadenopathy:     Cervical: No cervical adenopathy.  Neurological:     General: No focal deficit present.     Mental Status: She is alert and oriented to person, place, and time.  Psychiatric:        Mood and Affect: Mood normal.           Assessment & Plan:  .[name] was seen today for sinus problem.  Diagnoses  and all orders for this visit:  Acute non-recurrent pansinusitis -     amoxicillin-clavulanate (AUGMENTIN) 875-125 MG tablet; Take 1 tablet by mouth 2 (two) times daily.   3 weeks of URI symptoms Start augmentin Use flonase OTC Continue other symptomatic care Vitals reassuring Follow up as needed or if symptoms persist or worsen  Iran Planas, PA-C

## 2022-11-01 DIAGNOSIS — H04123 Dry eye syndrome of bilateral lacrimal glands: Secondary | ICD-10-CM | POA: Diagnosis not present

## 2022-11-01 DIAGNOSIS — Z961 Presence of intraocular lens: Secondary | ICD-10-CM | POA: Diagnosis not present

## 2022-11-01 DIAGNOSIS — H524 Presbyopia: Secondary | ICD-10-CM | POA: Diagnosis not present

## 2022-11-14 IMAGING — MG MM DIGITAL DIAGNOSTIC UNILAT*L* W/ TOMO W/ CAD
6 of 10 series · 6 of 30 positions shown · non-contrast
Comparison: Previous exam(s).

CLINICAL DATA: 65-year-old female for further evaluation of
possible LEFT breast distortion on screening mammogram.

EXAM:
DIGITAL DIAGNOSTIC UNILATERAL LEFT MAMMOGRAM WITH TOMOSYNTHESIS AND
CAD
TECHNIQUE: Left digital diagnostic mammography and breast tomosynthesis was
performed. The images were evaluated with computer-aided detection.

[L ML synth-2D]
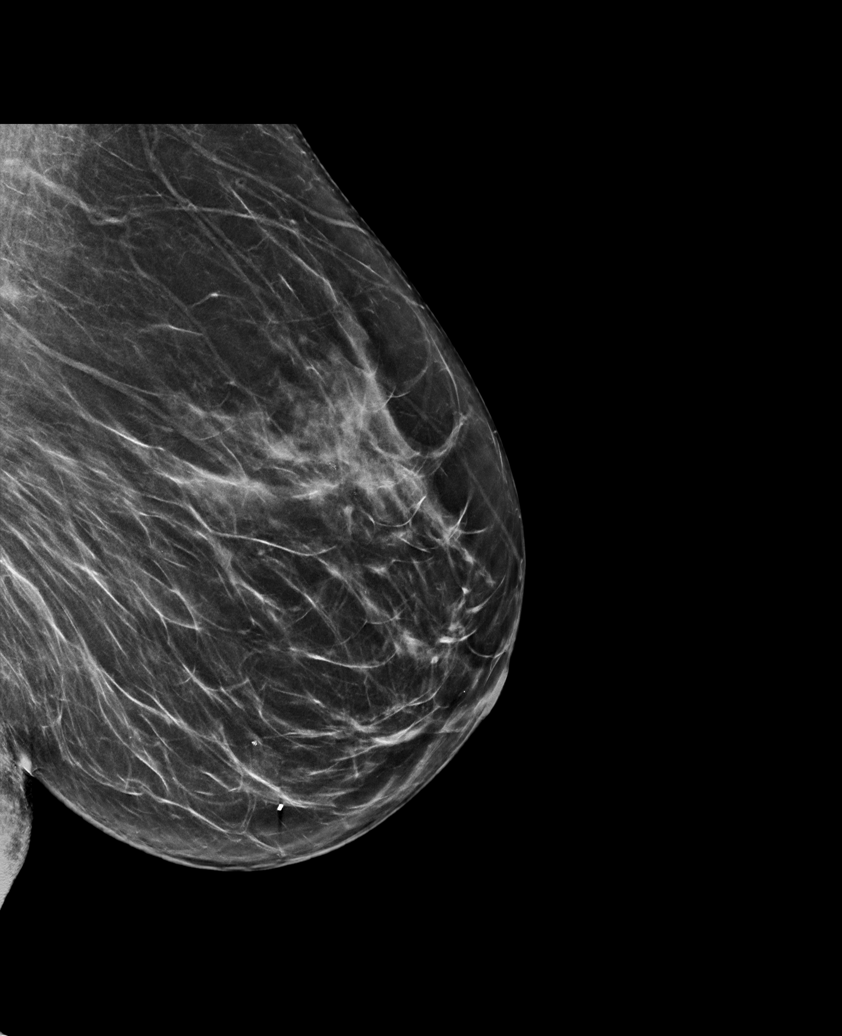

[L MLO synth-2D (1 of 2)]
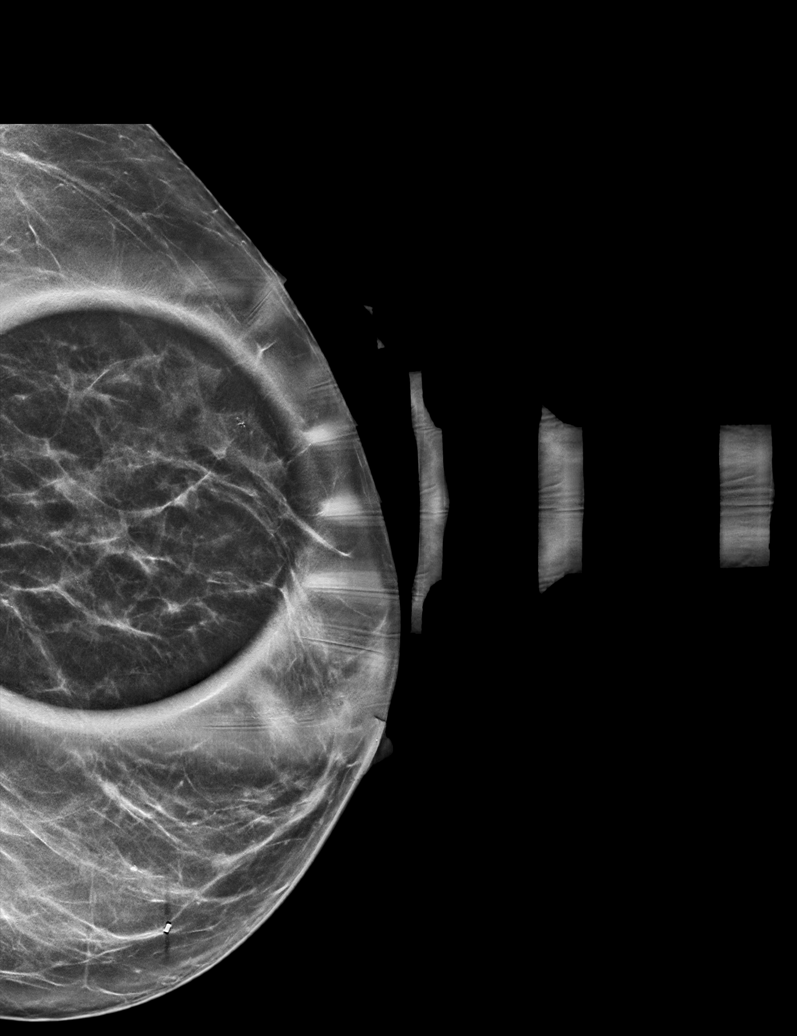

[L CC synth-2D (1 of 2)]
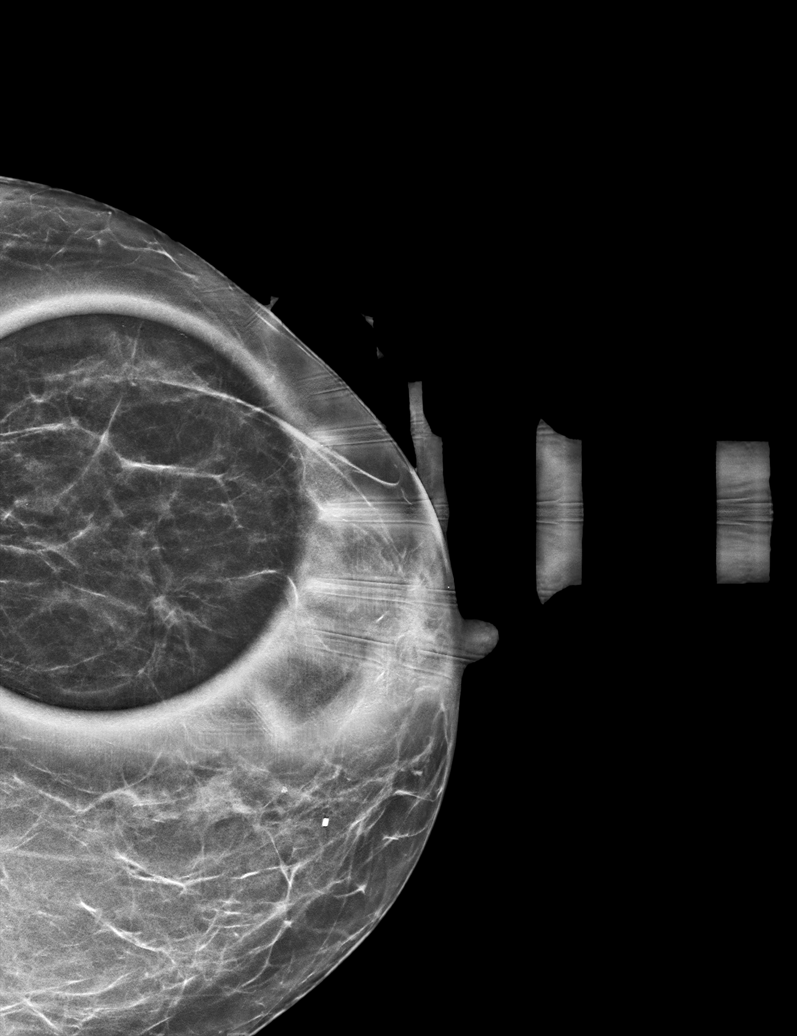

[L CC synth-2D (2 of 2)]
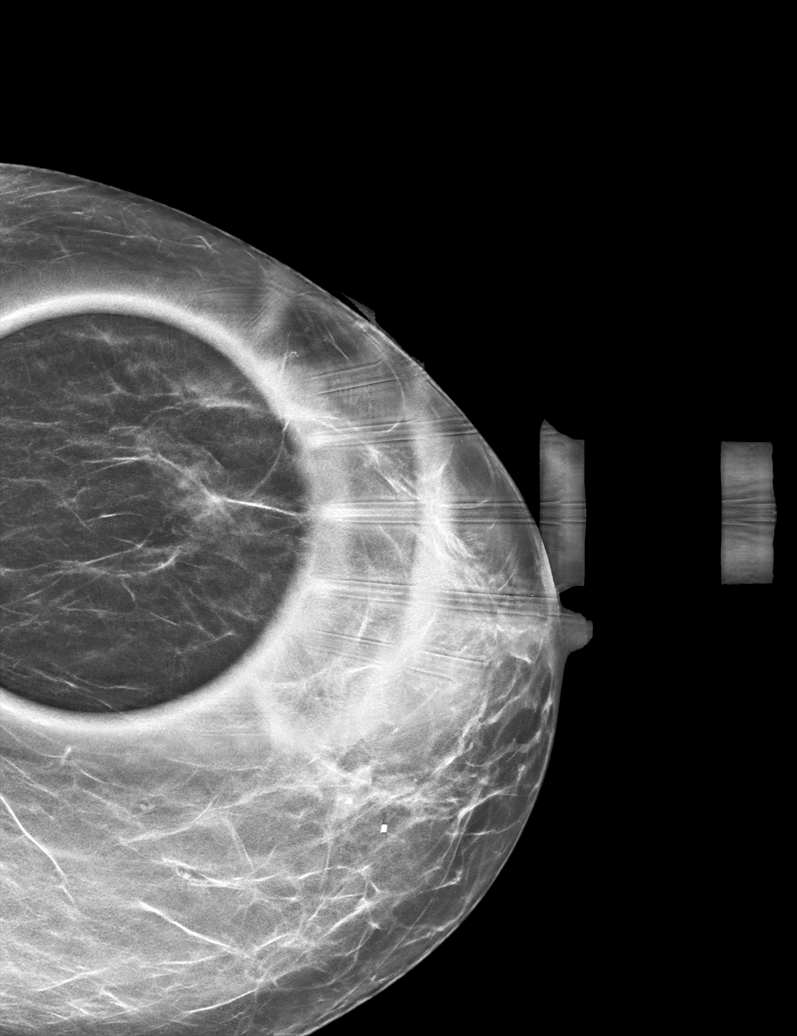

[L MLO synth-2D (2 of 2)]
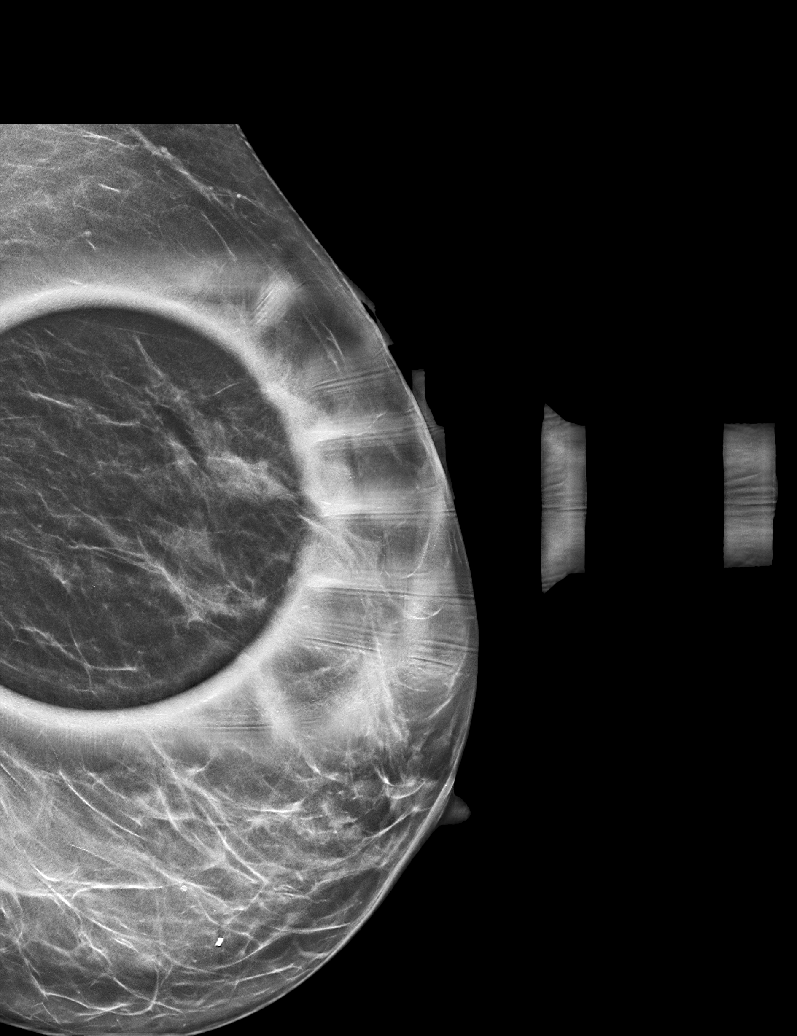

[L MLO tomo · tomo slice 27/54.0]
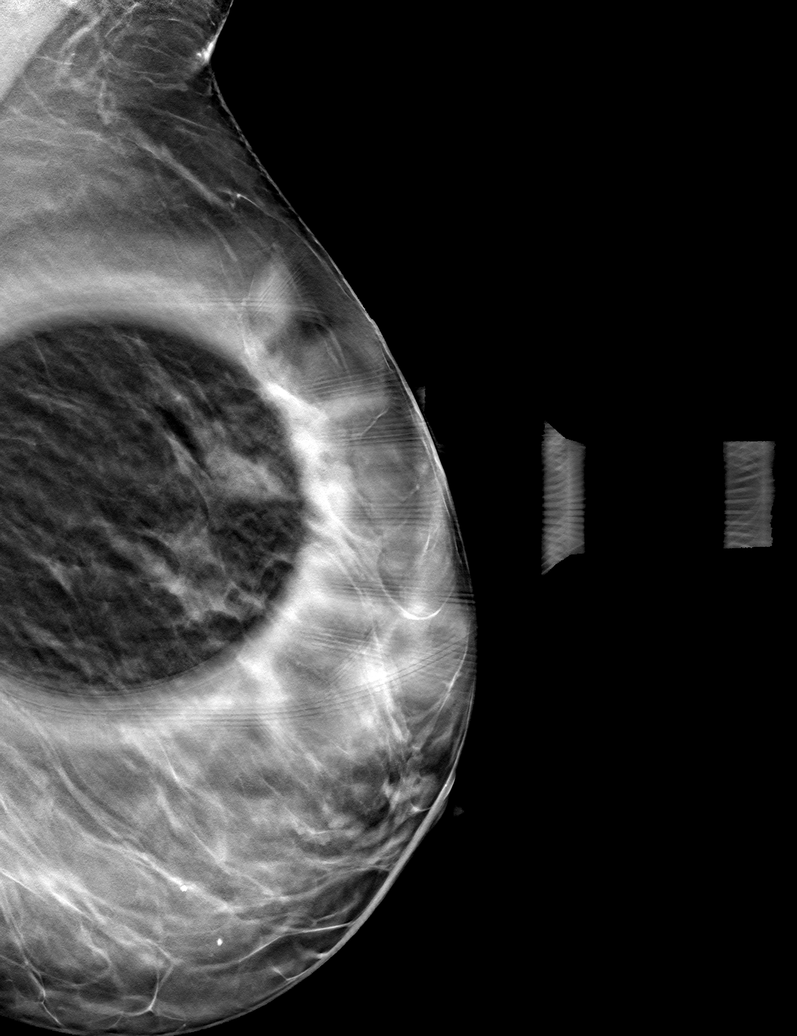

[6 of 30 positions shown; findings below may reference images not displayed]

ACR Breast Density Category b: There are scattered areas of
fibroglandular density.
FINDINGS: 2D/3D full field and spot compression views of the LEFT breast
demonstrate dispersal of the screening abnormality without
persistent distortion at the site of the screening study finding.
IMPRESSION: No persistent suspicious abnormality at the site of the screening
study finding.

RECOMMENDATION:
Bilateral screening mammogram in 1 year.

I have discussed the findings and recommendations with the patient.
If applicable, a reminder letter will be sent to the patient
regarding the next appointment.

BI-RADS CATEGORY  1: Negative.

## 2022-11-24 DIAGNOSIS — C541 Malignant neoplasm of endometrium: Secondary | ICD-10-CM | POA: Diagnosis not present

## 2022-11-26 DIAGNOSIS — U071 COVID-19: Secondary | ICD-10-CM | POA: Diagnosis not present

## 2022-11-26 DIAGNOSIS — Z20822 Contact with and (suspected) exposure to covid-19: Secondary | ICD-10-CM | POA: Diagnosis not present

## 2022-11-26 DIAGNOSIS — M791 Myalgia, unspecified site: Secondary | ICD-10-CM | POA: Diagnosis not present

## 2022-12-08 DIAGNOSIS — Z0000 Encounter for general adult medical examination without abnormal findings: Secondary | ICD-10-CM | POA: Diagnosis not present

## 2022-12-14 ENCOUNTER — Ambulatory Visit (INDEPENDENT_AMBULATORY_CARE_PROVIDER_SITE_OTHER): Payer: Medicare HMO | Admitting: Physician Assistant

## 2022-12-14 ENCOUNTER — Encounter: Payer: Self-pay | Admitting: Physician Assistant

## 2022-12-14 VITALS — BP 116/66 | HR 80 | Ht 61.0 in | Wt 149.1 lb

## 2022-12-14 DIAGNOSIS — U071 COVID-19: Secondary | ICD-10-CM

## 2022-12-14 DIAGNOSIS — J40 Bronchitis, not specified as acute or chronic: Secondary | ICD-10-CM

## 2022-12-14 DIAGNOSIS — J329 Chronic sinusitis, unspecified: Secondary | ICD-10-CM

## 2022-12-14 MED ORDER — AZITHROMYCIN 250 MG PO TABS: ORAL_TABLET | ORAL | 0 refills | Status: DC

## 2022-12-14 MED ORDER — PREDNISONE 50 MG PO TABS: ORAL_TABLET | ORAL | 0 refills | Status: DC

## 2022-12-14 NOTE — Progress Notes (Signed)
Acute Office Visit  Subjective:     Patient ID: Debra Brown, female    DOB: [birth year], 67 y.o.   MRN: [id number]  Chief Complaint  Patient presents with   Sinus Problem    Nasal drainage for a week    Sinus Problem Associated symptoms include congestion, coughing (secondary to postnasal drip) and ear pain. Pertinent negatives include no chills, headaches, shortness of breath or sore throat.   Patient is in today for sinus congestion.  She had Covid on 11/26/2022 and took Paxlovid. She states she got better and had no symptoms for about a week and a half but then started to have more sinus congestion a week ago. Additional symptoms include rhinorrhea described as thick yellow/green, postnasal drip, and otalgia. The postnasal drip and subsequent cough are worse at night and in the morning. She denies fever, chills, chest pain, shortness of breath, sore throat, headache, otorrhea, nausea, vomiting, abdominal pain, diarrhea, myalgias, arthralgias. She has been using Zyrtec-D, Flonase and saline nasal spray with only some relief.  She had a herpes labialis flare up a few days ago as well. This has cleared up with OTC Abreva.  Review of Systems  Constitutional:  Positive for malaise/fatigue. Negative for chills and fever.  HENT:  Positive for congestion, ear pain, sinus pain and tinnitus. Negative for ear discharge and sore throat.   Eyes:  Negative for pain and redness.  Respiratory:  Positive for cough (secondary to postnasal drip). Negative for shortness of breath.   Cardiovascular:  Negative for chest pain.  Gastrointestinal:  Negative for abdominal pain, diarrhea, nausea and vomiting.  Musculoskeletal:  Negative for joint pain and myalgias.  Neurological:  Negative for weakness and headaches.        Objective:    BP 116/66 (BP Location: Right Arm, Patient Position: Sitting, Cuff Size: Small)   Pulse 80   Ht '5\' 1"'$  (1.549 m)   Wt 149 lb 1.3 oz (67.6 kg)   SpO2 100%   BMI  28.17 kg/m  BP Readings from Last 3 Encounters:  12/14/22 116/66  10/31/22 (!) 144/80  07/13/22 138/72   Wt Readings from Last 3 Encounters:  12/14/22 149 lb 1.3 oz (67.6 kg)  10/31/22 152 lb (68.9 kg)  07/13/22 148 lb (67.1 kg)      Physical Exam Constitutional:      General: She is not in acute distress. HENT:     Head: Normocephalic and atraumatic.     Right Ear: No drainage. A middle ear effusion is present. Tympanic membrane is not injected, perforated or bulging.     Left Ear: No drainage. A middle ear effusion is present. Tympanic membrane is not injected, perforated or bulging.     Nose: Rhinorrhea present.     Right Turbinates: Enlarged.     Left Turbinates: Enlarged.     Right Sinus: Maxillary sinus tenderness and frontal sinus tenderness present.     Left Sinus: Maxillary sinus tenderness and frontal sinus tenderness present.     Mouth/Throat:     Mouth: Mucous membranes are moist. No oral lesions.     Pharynx: Posterior oropharyngeal erythema (mild) present. No oropharyngeal exudate.     Tonsils: No tonsillar exudate.  Eyes:     General:        Right eye: No discharge.        Left eye: No discharge.     Conjunctiva/sclera: Conjunctivae normal.  Cardiovascular:     Rate and Rhythm: Normal rate and  regular rhythm.     Heart sounds: No murmur heard. Pulmonary:     Effort: Pulmonary effort is normal.     Breath sounds: Normal breath sounds.  Lymphadenopathy:     Cervical: No cervical adenopathy.  Skin:    General: Skin is warm.  Neurological:     Mental Status: She is alert and oriented to person, place, and time.          Assessment & Plan:  .Marland KitchenJalayiah was seen today for sinus problem.  Diagnoses and all orders for this visit:  Sinobronchitis -     azithromycin (ZITHROMAX Z-PAK) 250 MG tablet; Take 2 tablets (500 mg) on  Day 1,  followed by 1 tablet (250 mg) once daily on Days 2 through 5. -     predniSONE (DELTASONE) 50 MG tablet; One tab PO daily  for 5 days.  COVID-19 virus infection    Likely secondary bacterial sinusitis post-Covid. Recommend a course of azithromycin and prednisone. Continue to use Flonase daily. She may try a humidifier as well. Follow up as needed if symptoms do not improve.  She questioned whether or not she should receive the Shingles vaccine today. Advised her that it would be best to get it once she is completely asymptomatic. We also discussed that since she has Medicare, she would need to go to her local pharmacy. Patient verbalizes understanding.

## 2022-12-24 ENCOUNTER — Other Ambulatory Visit: Payer: Self-pay | Admitting: Physician Assistant

## 2022-12-24 DIAGNOSIS — I10 Essential (primary) hypertension: Secondary | ICD-10-CM

## 2022-12-24 DIAGNOSIS — R0982 Postnasal drip: Secondary | ICD-10-CM

## 2023-02-20 ENCOUNTER — Telehealth: Payer: Self-pay | Admitting: Physician Assistant

## 2023-02-20 NOTE — Telephone Encounter (Signed)
Called patient to schedule Medicare Annual Wellness Visit (AWV). Left message for patient to call back and schedule Medicare Annual Wellness Visit (AWV).  Last date of AWV: 06/16/2021  Please schedule an appointment at any time with [facility].  If any questions, please contact me at [phone].  Thank you ,  Lin Givens Patient Access Advocate II Direct Dial: [phone]

## 2023-02-21 ENCOUNTER — Telehealth: Payer: Self-pay | Admitting: Physician Assistant

## 2023-02-21 NOTE — Telephone Encounter (Signed)
Contacted Debra Brown to schedule their annual wellness visit. Appointment made for 03/08/2023 at [place] Patient Access Advocate II Direct Dial: [phone]

## 2023-03-08 ENCOUNTER — Ambulatory Visit (INDEPENDENT_AMBULATORY_CARE_PROVIDER_SITE_OTHER): Payer: Medicare HMO | Admitting: Physician Assistant

## 2023-03-08 DIAGNOSIS — Z0000 Encounter for general adult medical examination without abnormal findings: Secondary | ICD-10-CM | POA: Diagnosis not present

## 2023-03-08 DIAGNOSIS — Z1231 Encounter for screening mammogram for malignant neoplasm of breast: Secondary | ICD-10-CM

## 2023-03-08 DIAGNOSIS — Z780 Asymptomatic menopausal state: Secondary | ICD-10-CM

## 2023-03-08 NOTE — Progress Notes (Signed)
MEDICARE ANNUAL WELLNESS VISIT  03/08/2023  Telephone Visit Disclaimer This Medicare AWV was conducted by telephone due to national recommendations for restrictions regarding the COVID-19 Pandemic (e.g. social distancing).  I verified, using two identifiers, that I am speaking with Debra Brown or their authorized healthcare agent. I discussed the limitations, risks, security, and privacy concerns of performing an evaluation and management service by telephone and the potential availability of an in-person appointment in the future. The patient expressed understanding and agreed to proceed.  Location of Patient: home Location of Provider (nurse):  in the office.  Subjective:    Debra Brown is a 67 y.o. female patient of Caleen Essex, Lonna Cobb, PA-C who had a [employer] Visit today via telephone. Debra Brown is Retired and lives with their family. she has 2 children. she reports that she is socially active and does interact with friends/family regularly. she is moderately physically active and enjoys spending time with family and friends, cooking and walking.  Patient Care Team: Nolene Ebbs as PCP - General (Family Medicine)     03/08/2023   11:02 AM 06/16/2021    9:01 AM 01/08/2020    2:39 PM  Advanced Directives  Does Patient Have a Medical Advance Directive? Yes Yes Yes  Type of Advance Directive Living will Living will;Healthcare Power of [employer] Power of [place];Living will  Does patient want to make changes to medical advance directive? No - Patient declined No - Patient declined   Copy of Healthcare Power of Attorney in Chart?  No - copy requested     Hospital Utilization Over the Past 12 Months: # of hospitalizations or ER visits: 0 # of surgeries: 0  Review of Systems    Patient reports that her overall health is unchanged compared to last year.  History obtained from chart review and the patient  Patient Reported Readings (BP, Pulse,  CBG, Weight, etc) none  Pain Assessment Pain : No/denies pain     Current Medications & Allergies (verified) Allergies as of 03/08/2023   No Known Allergies      Medication List        Accurate as of March 08, 2023 11:14 AM. If you have any questions, ask your nurse or doctor.          STOP taking these medications    azithromycin 250 MG tablet Commonly known as: Zithromax Z-Pak   predniSONE 50 MG tablet Commonly known as: DELTASONE       TAKE these medications    atorvastatin 20 MG tablet Commonly known as: LIPITOR Take 1 tablet (20 mg total) by mouth daily.   CALCIUM PO Take 600 mg by mouth daily.   CENTRUM SILVER PO Take by mouth daily.   cholecalciferol 25 MCG (1000 UNIT) tablet Commonly known as: VITAMIN D3 Take 2,000 Units by mouth daily.   fexofenadine 180 MG tablet Commonly known as: ALLEGRA Take 180 mg by mouth daily.   Fluad Quadrivalent 0.5 ML injection Generic drug: influenza vaccine adjuvanted   fluticasone 50 MCG/ACT nasal spray Commonly known as: FLONASE SPRAY 2 SPRAYS INTO EACH NOSTRIL EVERY DAY   levothyroxine 88 MCG tablet Commonly known as: SYNTHROID Take 88 mcg by mouth every morning. What changed: Another medication with the same name was removed. Continue taking this medication, and follow the directions you see here.   lisinopril-hydrochlorothiazide 20-25 MG tablet Commonly known as: ZESTORETIC TAKE 1 TABLET BY MOUTH EVERY DAY   Magnesium 250 MG Tabs Take by mouth  daily.   montelukast 10 MG tablet Commonly known as: SINGULAIR TAKE 1 TABLET BY MOUTH EVERYDAY AT BEDTIME   PROBIOTIC-10 PO Take by mouth daily.   SYSTANE BALANCE OP Apply to eye.   TURMERIC CURCUMIN PO Take by mouth daily. 450/10 mg   VITAMIN B 12 PO Take by mouth. Once a day   vitamin C 100 MG tablet Take 100 mg by mouth daily.   ZINC PO Take 50 mg by mouth daily.        History (reviewed): Past Medical History:  Diagnosis Date    HSV-1 (herpes simplex virus 1) infection    Hyperlipidemia    Thyroid disease    Uterine cancer    Past Surgical History:  Procedure Laterality Date   ABDOMINAL HYSTERECTOMY     CESAREAN SECTION     TONSILLECTOMY AND ADENOIDECTOMY     TUBAL LIGATION     Family History  Problem Relation Age of Onset   Hyperlipidemia Mother    Hypertension Mother    Heart attack Mother    Diabetes Mother    Heart attack Father    Heart attack Sister    Diabetes Sister    Heart attack Sister    Diabetes Sister    CAD Sister    Cancer Sister    Stroke Paternal Aunt    Social History   Socioeconomic History   Marital status: Married    Spouse name: Kylar Speelman   Number of children: 2   Years of education: 12   Highest education level: 12th grade  Occupational History   Occupation: Retired  Tobacco Use   Smoking status: Never   Smokeless tobacco: Never  Vaping Use   Vaping Use: Never used  Substance and Sexual Activity   Alcohol use: Yes    Comment: occassionally   Drug use: No   Sexual activity: Yes    Partners: Male  Other Topics Concern   Not on file  Social History Narrative   Lives with her husband along with her son and 2 grandchildren. She walks 3 miles everyday. She enjoys spending time with family and friends, cooking and walking.   Social Determinants of Health   Financial Resource Strain: Patient Declined (03/06/2023)   Overall Financial Resource Strain (CARDIA)    Difficulty of Paying Living Expenses: Patient declined  Food Insecurity: Patient Declined (03/06/2023)   Hunger Vital Sign    Worried About Running Out of Food in the Last Year: Patient declined    Ran Out of Food in the Last Year: Patient declined  Transportation Needs: No Transportation Needs (03/06/2023)   PRAPARE - Administrator, Civil Service (Medical): No    Lack of Transportation (Non-Medical): No  Physical Activity: Sufficiently Active (03/06/2023)   Exercise Vital Sign    Days of  Exercise per Week: 5 days    Minutes of Exercise per Session: 60 min  Stress: No Stress Concern Present (03/06/2023)   [employer] of Occupational Health - Occupational Stress Questionnaire    Feeling of Stress : Not at all  Social Connections: Unknown (03/08/2023)   Social Connection and Isolation Panel [NHANES]    Frequency of Communication with Friends and Family: More than three times a week    Frequency of Social Gatherings with Friends and Family: Once a week    Attends Religious Services: Never    Database administrator or Organizations: Patient declined    Attends Banker Meetings: Patient declined  Marital Status: Married    Activities of Daily Living    03/06/2023    9:35 AM  In your present state of health, do you have any difficulty performing the following activities:  Hearing? 0  Vision? 0  Difficulty concentrating or making decisions? 0  Walking or climbing stairs? 0  Dressing or bathing? 0  Doing errands, shopping? 0  Preparing Food and eating ? N  Using the Toilet? N  In the past six months, have you accidently leaked urine? N  Do you have problems with loss of bowel control? N  Managing your Medications? N  Managing your Finances? N  Housekeeping or managing your Housekeeping? N    Patient Education/ Literacy How often do you need to have someone help you when you read instructions, pamphlets, or other written materials from your doctor or pharmacy?: 1 - Never What is the last grade level you completed in school?: 12th grade  Exercise Current Exercise Habits: Home exercise routine, Type of exercise: walking;strength training/weights, Time (Minutes): 60, Frequency (Times/Week): 5, Weekly Exercise (Minutes/Week): 300, Intensity: Moderate, Exercise limited by: None identified  Diet Patient reports consuming 3 meals a day and 2 snack(s) a day Patient reports that her primary diet is: Regular Patient reports that she does have regular access  to food.   Depression Screen    03/08/2023   11:02 AM 07/13/2022    9:32 AM 01/14/2022    9:00 AM 07/13/2021    9:10 AM 07/13/2021    8:48 AM 06/16/2021    9:05 AM 07/06/2020    9:52 AM  PHQ 2/9 Scores  PHQ - 2 Score 0 0 0 0 0 0 0  PHQ- 9 Score  0  0        Fall Risk    03/08/2023   11:02 AM 03/06/2023    9:35 AM 12/14/2022    7:24 AM 01/14/2022    9:00 AM 07/13/2021    8:48 AM  Fall Risk   Falls in the past year? 0 0 0 0 0  Number falls in past yr: 0  0 0 0  Injury with Fall? 0  0 0 0  Risk for fall due to : No Fall Risks  No Fall Risks No Fall Risks   Follow up Falls evaluation completed  Falls evaluation completed Falls evaluation completed      Objective:  Debra Brown seemed alert and oriented and she participated appropriately during our telephone visit.  Blood Pressure Weight BMI  BP Readings from Last 3 Encounters:  12/14/22 116/66  10/31/22 (!) 144/80  07/13/22 138/72   Wt Readings from Last 3 Encounters:  12/14/22 149 lb 1.3 oz (67.6 kg)  10/31/22 152 lb (68.9 kg)  07/13/22 148 lb (67.1 kg)   BMI Readings from Last 1 Encounters:  12/14/22 28.17 kg/m    *Unable to obtain current vital signs, weight, and BMI due to telephone visit type  Hearing/Vision  Debra Brown did not seem to have difficulty with hearing/understanding during the telephone conversation Reports that she has had a formal eye exam by an eye care professional within the past year Reports that she has not had a formal hearing evaluation within the past year *Unable to fully assess hearing and vision during telephone visit type  Cognitive Function:    03/08/2023   11:06 AM 06/16/2021    9:13 AM  6CIT Screen  What Year? 0 points 0 points  What month? 0 points 0 points  What time? 0  points 0 points  Count back from 20 0 points 0 points  Months in reverse 0 points 0 points  Repeat phrase 0 points 0 points  Total Score 0 points 0 points   (Normal:0-7, Significant for Dysfunction: >8)  Normal  Cognitive Function Screening: Yes   Immunization & Health Maintenance Record Immunization History  Administered Date(s) Administered   Fluad Quad(high Dose 65+) 09/15/2021, 07/13/2022   Influenza Inj Mdck Quad Pf 09/15/2015   Influenza Split 08/02/2012, 11/26/2016   Influenza, High Dose Seasonal PF 09/05/2019   Influenza, Seasonal, Injecte, Preservative Fre 09/15/2015   Influenza,trivalent, recombinat, inj, PF 08/02/2012, 11/26/2016   Influenza-Unspecified 08/17/2017, 09/01/2018, 08/22/2019   PFIZER(Purple Top)SARS-COV-2 Vaccination 06/09/2020, 06/30/2020   Pneumococcal Conjugate-13 07/15/2019   Pneumococcal Polysaccharide-23 07/13/2021   Tdap 08/02/2012, 07/25/2017    Health Maintenance  Topic Date Due   Zoster Vaccines- Shingrix (1 of 2) 03/15/2023 (Originally 07/09/1975)   COVID-19 Vaccine (3 - Pfizer risk series) 03/24/2023 (Originally 07/28/2020)   INFLUENZA VACCINE  06/22/2023   Medicare Annual Wellness (AWV)  03/07/2024   MAMMOGRAM  08/11/2024   DTaP/Tdap/Td (3 - Td or Tdap) 07/26/2027   COLONOSCOPY (Pts 45-34yrs Insurance coverage will need to be confirmed)  05/06/2032   Pneumonia Vaccine 95+ Years old  Completed   DEXA SCAN  Completed   Hepatitis C Screening  Completed   HPV VACCINES  Aged Out       Assessment  This is a routine wellness examination for [employer].  Health Maintenance: Due or Overdue There are no preventive care reminders to display for this patient.   Debra Brown does not need a referral for Community Assistance: Care Management:   no Social Work:    no Prescription Assistance:  no Nutrition/Diabetes Education:  no   Plan:  Personalized Goals  Goals Addressed               This Visit's Progress     Patient Stated (pt-stated)        Patient stated she would like to be more stronger and be more active.        Personalized Health Maintenance & Screening Recommendations  Bone densitometry screening Shingles vaccine  Lung  Cancer Screening Recommended: no (Low Dose CT Chest recommended if Age 20-80 years, 30 pack-year currently smoking OR have quit w/in past 15 years) Hepatitis C Screening recommended: no HIV Screening recommended: no  Advanced Directives: Written information was not prepared per patient's request.  Referrals & Orders Orders Placed This Encounter  Procedures   DEXAScan   Mammogram 3D SCREEN BREAST BILATERAL    Follow-up Plan Follow-up with Jomarie Longs, PA-C as planned Schedule shingles vaccine at the pharmacy.  Medicare wellness visit in one year. Patient will access AVS on my chart.   I have personally reviewed and noted the following in the patient's chart:   Medical and social history Use of alcohol, tobacco or illicit drugs  Current medications and supplements Functional ability and status Nutritional status Physical activity Advanced directives List of other physicians Hospitalizations, surgeries, and ER visits in previous 12 months Vitals Screenings to include cognitive, depression, and falls Referrals and appointments  In addition, I have reviewed and discussed with Debra Brown certain preventive protocols, quality metrics, and best practice recommendations. A written personalized care plan for preventive services as well as general preventive health recommendations is available and can be mailed to the patient at her request.      Modesto Charon, RN BSN  03/08/2023

## 2023-03-08 NOTE — Patient Instructions (Signed)
MEDICARE ANNUAL WELLNESS VISIT Health Maintenance Summary and Written Plan of Care  [name] ,  Thank you for allowing me to perform your Medicare Annual Wellness Visit and for your ongoing commitment to your health.   Health Maintenance & Immunization History Health Maintenance  Topic Date Due   Zoster Vaccines- Shingrix (1 of 2) 03/15/2023 (Originally 07/09/1975)   COVID-19 Vaccine (3 - Pfizer risk series) 03/24/2023 (Originally 07/28/2020)   INFLUENZA VACCINE  06/22/2023   Medicare Annual Wellness (AWV)  03/07/2024   MAMMOGRAM  08/11/2024   DTaP/Tdap/Td (3 - Td or Tdap) 07/26/2027   COLONOSCOPY (Pts 45-55yrs Insurance coverage will need to be confirmed)  05/06/2032   Pneumonia Vaccine 86+ Years old  Completed   DEXA SCAN  Completed   Hepatitis C Screening  Completed   HPV VACCINES  Aged Out   Immunization History  Administered Date(s) Administered   Fluad Quad(high Dose 65+) 09/15/2021, 07/13/2022   Influenza Inj Mdck Quad Pf 09/15/2015   Influenza Split 08/02/2012, 11/26/2016   Influenza, High Dose Seasonal PF 09/05/2019   Influenza, Seasonal, Injecte, Preservative Fre 09/15/2015   Influenza,trivalent, recombinat, inj, PF 08/02/2012, 11/26/2016   Influenza-Unspecified 08/17/2017, 09/01/2018, 08/22/2019   PFIZER(Purple Top)SARS-COV-2 Vaccination 06/09/2020, 06/30/2020   Pneumococcal Conjugate-13 07/15/2019   Pneumococcal Polysaccharide-23 07/13/2021   Tdap 08/02/2012, 07/25/2017    These are the patient goals that we discussed:  Goals Addressed               This Visit's Progress     Patient Stated (pt-stated)        Patient stated she would like to be more stronger and be more active.          This is a list of Health Maintenance Items that are overdue or due now: Bone densitometry screening Shingles vaccineThere are no preventive care reminders to display for this patient.    Orders/Referrals Placed Today: Orders Placed This Encounter  Procedures    DEXAScan    Standing Status:   Future    Standing Expiration Date:   03/07/2024    Scheduling Instructions:     Please call patient to schedule. She would like to have her mammogram at the same time.    Order Specific Question:   Reason for exam:    Answer:   post menopausal    Order Specific Question:   Preferred imaging location?    Answer:   [facility]   Mammogram 3D SCREEN BREAST BILATERAL    Standing Status:   Future    Standing Expiration Date:   03/07/2024    Scheduling Instructions:     Please call patient to schedule.    Order Specific Question:   Reason for Exam (SYMPTOM  OR DIAGNOSIS REQUIRED)    Answer:   breast cancer screening    Order Specific Question:   Preferred imaging location?    Answer:   [facility]   (Contact our referral department at [phone] if you have not spoken with someone about your referral appointment within the next 5 days)    Follow-up Plan Follow-up with Jomarie Longs, PA-C as planned Schedule shingles vaccine at the pharmacy.  Medicare wellness visit in one year. Patient will access AVS on my chart.      Health Maintenance, Female Adopting a healthy lifestyle and getting preventive care are important in promoting health and wellness. Ask your health care provider about: The right schedule for you to have regular tests and exams. Things you can  do on your own to prevent diseases and keep yourself healthy. What should I know about diet, weight, and exercise? Eat a healthy diet  Eat a diet that includes plenty of vegetables, fruits, low-fat dairy products, and lean protein. Do not eat a lot of foods that are high in solid fats, added sugars, or sodium. Maintain a healthy weight Body mass index (BMI) is used to identify weight problems. It estimates body fat based on height and weight. Your health care provider can help determine your BMI and help you achieve or maintain a healthy weight. Get regular exercise Get  regular exercise. This is one of the most important things you can do for your health. Most adults should: Exercise for at least 150 minutes each week. The exercise should increase your heart rate and make you sweat (moderate-intensity exercise). Do strengthening exercises at least twice a week. This is in addition to the moderate-intensity exercise. Spend less time sitting. Even light physical activity can be beneficial. Watch cholesterol and blood lipids Have your blood tested for lipids and cholesterol at 69 years of age, then have this test every 5 years. Have your cholesterol levels checked more often if: Your lipid or cholesterol levels are high. You are older than 69 years of age. You are at high risk for heart disease. What should I know about cancer screening? Depending on your health history and family history, you may need to have cancer screening at various ages. This may include screening for: Breast cancer. Cervical cancer. Colorectal cancer. Skin cancer. Lung cancer. What should I know about heart disease, diabetes, and high blood pressure? Blood pressure and heart disease High blood pressure causes heart disease and increases the risk of stroke. This is more likely to develop in people who have high blood pressure readings or are overweight. Have your blood pressure checked: Every 3-5 years if you are 25-48 years of age. Every year if you are 69 years old or older. Diabetes Have regular diabetes screenings. This checks your fasting blood sugar level. Have the screening done: Once every three years after age 77 if you are at a normal weight and have a low risk for diabetes. More often and at a younger age if you are overweight or have a high risk for diabetes. What should I know about preventing infection? Hepatitis B If you have a higher risk for hepatitis B, you should be screened for this virus. Talk with your health care provider to find out if you are at risk for  hepatitis B infection. Hepatitis C Testing is recommended for: Everyone born from [birth year] through 1967. Anyone with known risk factors for hepatitis C. Sexually transmitted infections (STIs) Get screened for STIs, including gonorrhea and chlamydia, if: You are sexually active and are younger than 69 years of age. You are older than 69 years of age and your health care provider tells you that you are at risk for this type of infection. Your sexual activity has changed since you were last screened, and you are at increased risk for chlamydia or gonorrhea. Ask your health care provider if you are at risk. Ask your health care provider about whether you are at high risk for HIV. Your health care provider may recommend a prescription medicine to help prevent HIV infection. If you choose to take medicine to prevent HIV, you should first get tested for HIV. You should then be tested every 3 months for as long as you are taking the medicine. Pregnancy If you  are about to stop having your period (premenopausal) and you may become pregnant, seek counseling before you get pregnant. Take 400 to 800 micrograms (mcg) of folic acid every day if you become pregnant. Ask for birth control (contraception) if you want to prevent pregnancy. Osteoporosis and menopause Osteoporosis is a disease in which the bones lose minerals and strength with aging. This can result in bone fractures. If you are 66 years old or older, or if you are at risk for osteoporosis and fractures, ask your health care provider if you should: Be screened for bone loss. Take a calcium or vitamin D supplement to lower your risk of fractures. Be given hormone replacement therapy (HRT) to treat symptoms of menopause. Follow these instructions at home: Alcohol use Do not drink alcohol if: Your health care provider tells you not to drink. You are pregnant, may be pregnant, or are planning to become pregnant. If you drink alcohol: Limit how much  you have to: 0-1 drink a day. Know how much alcohol is in your drink. In the [place] one drink equals one 12 oz bottle of beer (355 mL), one 5 oz glass of wine (148 mL), or one 1 oz glass of hard liquor (44 mL). Lifestyle Do not use any products that contain nicotine or tobacco. These products include cigarettes, chewing tobacco, and vaping devices, such as e-cigarettes. If you need help quitting, ask your health care provider. Do not use street drugs. Do not share needles. Ask your health care provider for help if you need support or information about quitting drugs. General instructions Schedule regular health, dental, and eye exams. Stay current with your vaccines. Tell your health care provider if: You often feel depressed. You have ever been abused or do not feel safe at home. Summary Adopting a healthy lifestyle and getting preventive care are important in promoting health and wellness. Follow your health care provider's instructions about healthy diet, exercising, and getting tested or screened for diseases. Follow your health care provider's instructions on monitoring your cholesterol and blood pressure. This information is not intended to replace advice given to you by your health care provider. Make sure you discuss any questions you have with your health care provider. Document Revised: 03/29/2021 Document Reviewed: 03/29/2021 Elsevier Patient Education  2023 [employer].

## 2023-04-28 DIAGNOSIS — E890 Postprocedural hypothyroidism: Secondary | ICD-10-CM | POA: Diagnosis not present

## 2023-05-22 ENCOUNTER — Ambulatory Visit (INDEPENDENT_AMBULATORY_CARE_PROVIDER_SITE_OTHER): Payer: Medicare HMO | Admitting: Physician Assistant

## 2023-05-22 ENCOUNTER — Ambulatory Visit (INDEPENDENT_AMBULATORY_CARE_PROVIDER_SITE_OTHER): Payer: Medicare HMO

## 2023-05-22 VITALS — BP 128/66 | HR 64 | Ht 61.0 in | Wt 149.0 lb

## 2023-05-22 DIAGNOSIS — M79642 Pain in left hand: Secondary | ICD-10-CM | POA: Diagnosis not present

## 2023-05-22 DIAGNOSIS — M79641 Pain in right hand: Secondary | ICD-10-CM | POA: Diagnosis not present

## 2023-05-22 DIAGNOSIS — M7989 Other specified soft tissue disorders: Secondary | ICD-10-CM

## 2023-05-22 DIAGNOSIS — M1812 Unilateral primary osteoarthritis of first carpometacarpal joint, left hand: Secondary | ICD-10-CM | POA: Diagnosis not present

## 2023-05-22 DIAGNOSIS — M19041 Primary osteoarthritis, right hand: Secondary | ICD-10-CM | POA: Diagnosis not present

## 2023-05-22 MED ORDER — MELOXICAM 7.5 MG PO TABS
7.5000 mg | ORAL_TABLET | Freq: Every day | ORAL | 2 refills | Status: DC
Start: 2023-05-22 — End: 2023-07-25

## 2023-05-22 NOTE — Progress Notes (Signed)
Acute Office Visit  Subjective:     Patient ID: Debra Brown, female    DOB: Oct 24, 1956, 67 y.o.   MRN: [id number]  Chief Complaint  Patient presents with   Pain    In both hands     HPI Patient is in today for bilateral hand pain involving multiple joints and swelling. She has noticed intermittently over the past year but really bad in the last 2 weeks. She is using voltaren gel which does help some. Her hands are really stiff in the mornings especially. Warm water seems to help some.   .. Active Ambulatory Problems    Diagnosis Date Noted   Hypothyroidism    Essential hypertension    HSV-1 (herpes simplex virus 1) infection    Allergic conjunctivitis 08/02/2012   External hemorrhoids 08/02/2012   Hemangioma 08/02/2012   Preventive measure 08/02/2012   History of uterine cancer 01/10/2020   Post-menopausal 01/10/2020   Osteopenia 01/22/2020   LBBB (left bundle branch block) 03/10/2019   Endometrial carcinoma (HCC) 02/28/2019   Elevated LDL cholesterol level 07/07/2020   Seborrheic keratoses 07/10/2020   PND (post-nasal drip) 07/13/2021   Sun-induced skin changes, keratosis 07/13/2021   Elevated hemoglobin (HCC) 07/14/2021   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   Primary osteoarthritis of both hands 05/02/2022   Greater trochanteric bursitis of right hip 05/02/2022   Resolved Ambulatory Problems    Diagnosis Date Noted   Sinobronchitis 04/13/2020   Past Medical History:  Diagnosis Date   Hyperlipidemia    Thyroid disease    Uterine cancer (HCC)          Objective:    BP 128/66 (BP Location: Left Arm, Patient Position: Sitting, Cuff Size: Normal)   Pulse 64   Ht 5\' 1"  (1.549 m)   Wt 149 lb (67.6 kg)   SpO2 98%   BMI 28.15 kg/m  BP Readings from Last 3 Encounters:  05/22/23 128/66  12/14/22 116/66  10/31/22 (!) 144/80   Wt Readings from Last 3 Encounters:  05/22/23 149 lb (67.6 kg)  12/14/22 149 lb 1.3 oz (67.6 kg)  10/31/22 152 lb (68.9 kg)       Physical Exam Constitutional:      Appearance: Normal appearance.  Cardiovascular:     Rate and Rhythm: Normal rate.  Pulmonary:     Effort: Pulmonary effort is normal.  Musculoskeletal:     Comments: No seen swelling of hands today Tenderness over MCP of multiple joints and over dorsum of bilateral hands Hand grip 5/5, bilaterally.    Skin:    Findings: No erythema or rash.  Neurological:     General: No focal deficit present.     Mental Status: She is alert.  Psychiatric:        Mood and Affect: Mood normal.          Assessment & Plan:  .Marland KitchenAlga was seen today for pain.  Diagnoses and all orders for this visit:  Pain in both hands -     DG Hand Complete Left; Future -     DG Hand Complete Right; Future -     meloxicam (MOBIC) 7.5 MG tablet; Take 1 tablet (7.5 mg total) by mouth daily. -     ANA Screen,IFA,Reflex Titer/Pattern,Reflex Mplx 11 Ab Cascade with IdentRA -     Cyclic citrul peptide antibody, IgG -     Sed Rate (ESR) -     C-reactive protein  Bilateral hand swelling -     DG Hand Complete  Left; Future -     DG Hand Complete Right; Future -     meloxicam (MOBIC) 7.5 MG tablet; Take 1 tablet (7.5 mg total) by mouth daily. -     ANA Screen,IFA,Reflex Titer/Pattern,Reflex Mplx 11 Ab Cascade with IdentRA -     Cyclic citrul peptide antibody, IgG -     Sed Rate (ESR) -     C-reactive protein   Will evaluate for OA vs RA Labs ordered Xray ordered Start mobic daily Given hand exercises to start Consider pararafin wax treatments at night Follow up in 2 weeks  Return in about 2 months (around 07/23/2023).  Tandy Gaw, PA-C

## 2023-05-22 NOTE — Patient Instructions (Addendum)
Trial of mobic in the morning daily Use parafin wax treatment at night Get xrays  Osteoarthritis  Osteoarthritis is a type of arthritis. It refers to joint pain or joint disease. Osteoarthritis affects tissue that covers the ends of bones in joints (cartilage). Cartilage acts as a cushion between the bones and helps them move smoothly. Osteoarthritis occurs when cartilage in the joints gets worn down. Osteoarthritis is sometimes called "wear and tear" arthritis. Osteoarthritis is the most common form of arthritis. It often occurs in older people. It is a condition that gets worse over time. The joints most often affected by this condition are in the fingers, toes, hips, knees, and spine, including the neck and lower back. What are the causes? This condition is caused by the wearing down of cartilage that covers the ends of bones. What increases the risk? The following factors may make you more likely to develop this condition: Being age 48 or older. Obesity. Overuse of joints. Past injury of a joint. Past surgery on a joint. Family history of osteoarthritis. What are the signs or symptoms? The main symptoms of this condition are pain, swelling, and stiffness in the joint. Other symptoms may include: An enlarged joint. More pain and further damage caused by small pieces of bone or cartilage that break off and float inside of the joint. Small deposits of bone (osteophytes) that grow on the edges of the joint. A grating or scraping feeling inside the joint when you move it. Popping or creaking sounds when you move. Difficulty walking or exercising. An inability to grip items, twist your hand, or control the movements of your hands and fingers. How is this diagnosed? This condition may be diagnosed based on: Your medical history. A physical exam. Your symptoms. X-rays of the affected joints. Blood tests to rule out other types of arthritis. How is this treated? There is no cure for  this condition, but treatment can help control pain and improve joint function. Treatment may include a combination of therapies, such as: Pain relief techniques, such as: Applying heat and cold to the joint. Massage. A form of talk therapy called cognitive behavioral therapy (CBT). This therapy helps you set goals and follow up on the changes that you make. Medicines for pain and inflammation. The medicines can be taken by mouth or applied to the skin. They include: NSAIDs, such as ibuprofen. Prescription medicines. Strong anti-inflammatory medicines (corticosteroids). Certain nutritional supplements. A prescribed exercise program. You may work with a physical therapist. Assistive devices, such as a brace, wrap, splint, specialized glove, or cane. A weight control plan. Surgery, such as: An osteotomy. This is done to reposition the bones and relieve pain or to remove loose pieces of bone and cartilage. Joint replacement surgery. You may need this surgery if you have advanced osteoarthritis. Follow these instructions at home: Activity Rest your affected joints as told by your health care provider. Exercise as told by your provider. The provider may recommend specific types of exercise, such as: Strengthening exercises. These are done to strengthen the muscles that support joints affected by arthritis. Aerobic activities. These are exercises, such as brisk walking or water aerobics, that increase your heart rate. Range-of-motion activities. These help your joints move more easily. Balance and agility exercises. Managing pain, stiffness, and swelling     If told, apply heat to the affected area as often as told by your provider. Use the heat source that your provider recommends, such as a moist heat pack or a heating pad.  If you have a removable assistive device, remove it as told by your provider. Place a towel between your skin and the heat source. If your provider tells you to keep the  assistive device on while you apply heat, place a towel between the assistive device and the heat source. Leave the heat on for 20-30 minutes. If told, put ice on the affected area. If you have a removable assistive device, remove it as told by your provider. Put ice in a plastic bag. Place a towel between your skin and the bag. If your provider tells you to keep the assistive device on during icing, place a towel between the assistive device and the bag. Leave the ice on for 20 minutes, 2-3 times a day. If your skin turns bright red, remove the ice or heat right away to prevent skin damage. The risk of damage is higher if you cannot feel pain, heat, or cold. Move your fingers or toes often to reduce stiffness and swelling. Raise (elevate) the affected area above the level of your heart while you are sitting or lying down. General instructions Take over-the-counter and prescription medicines only as told by your provider. Maintain a healthy weight. Follow instructions from your provider for weight control. Do not use any products that contain nicotine or tobacco. These products include cigarettes, chewing tobacco, and vaping devices, such as e-cigarettes. If you need help quitting, ask your provider. Use assistive devices as told by your provider. Where to find more information [employer] of Arthritis and Musculoskeletal and Skin Diseases: niams.http://www.myers.net/ [employer] on Aging: BaseRingTones.pl American College of Rheumatology: rheumatology.org Contact a health care provider if: You have redness, swelling, or a feeling of warmth in a joint that gets worse. You have a fever along with joint or muscle aches. You develop a rash. You have trouble doing your normal activities. You have pain that gets worse and is not relieved by pain medicine. This information is not intended to replace advice given to you by your health care provider. Make sure you discuss any questions you have with your  health care provider. Document Revised: 07/07/2022 Document Reviewed: 07/07/2022 Elsevier Patient Education  2024 [employer].

## 2023-05-23 LAB — C-REACTIVE PROTEIN: CRP: 3.9 mg/L (ref <8.0)

## 2023-05-23 NOTE — Progress Notes (Signed)
Inflammatory markers are normal.  RA labs still pending along with xray has not been resulted.

## 2023-05-26 ENCOUNTER — Encounter: Payer: Self-pay | Admitting: Physician Assistant

## 2023-05-26 NOTE — Progress Notes (Signed)
Confirmed arthritis of hand. Is the mobic helping any?

## 2023-05-31 LAB — ANA SCREEN,IFA,REFLEX TITER/PATTERN,REFLEX MPLX 11 AB CASCADE
Anti Nuclear Antibody (ANA): NEGATIVE
Cyclic Citrullin Peptide Ab: <16 UNITS
MUTATED CITRULLINATED VIMENTIN (MCV) AB: 104 U/mL — ABNORMAL HIGH (ref <20)
Rheumatoid fact SerPl-aCnc: <10 IU/mL (ref <14)

## 2023-05-31 LAB — SEDIMENTATION RATE: Sed Rate: 9 mm/h (ref 0–30)

## 2023-06-01 NOTE — Progress Notes (Signed)
ANA is negative and rheumatoid labs look to be negative. One antibody was positive that can be associated with RA but with everything else negative I suggest treating like OA unless symptoms worsen we can re-look at other antibodies.

## 2023-06-06 DIAGNOSIS — Z961 Presence of intraocular lens: Secondary | ICD-10-CM | POA: Diagnosis not present

## 2023-06-06 DIAGNOSIS — H43811 Vitreous degeneration, right eye: Secondary | ICD-10-CM | POA: Diagnosis not present

## 2023-06-06 DIAGNOSIS — H04123 Dry eye syndrome of bilateral lacrimal glands: Secondary | ICD-10-CM | POA: Diagnosis not present

## 2023-06-25 ENCOUNTER — Other Ambulatory Visit: Payer: Self-pay | Admitting: Physician Assistant

## 2023-06-25 DIAGNOSIS — R0982 Postnasal drip: Secondary | ICD-10-CM

## 2023-07-25 ENCOUNTER — Ambulatory Visit (INDEPENDENT_AMBULATORY_CARE_PROVIDER_SITE_OTHER): Payer: Medicare HMO | Admitting: Physician Assistant

## 2023-07-25 ENCOUNTER — Encounter: Payer: Self-pay | Admitting: Physician Assistant

## 2023-07-25 VITALS — BP 128/82 | HR 62 | Temp 98.4°F | Ht 61.5 in | Wt 153.1 lb

## 2023-07-25 DIAGNOSIS — E7800 Pure hypercholesterolemia, unspecified: Secondary | ICD-10-CM | POA: Diagnosis not present

## 2023-07-25 DIAGNOSIS — K121 Other forms of stomatitis: Secondary | ICD-10-CM | POA: Diagnosis not present

## 2023-07-25 DIAGNOSIS — D582 Other hemoglobinopathies: Secondary | ICD-10-CM | POA: Diagnosis not present

## 2023-07-25 DIAGNOSIS — M19042 Primary osteoarthritis, left hand: Secondary | ICD-10-CM | POA: Diagnosis not present

## 2023-07-25 DIAGNOSIS — E039 Hypothyroidism, unspecified: Secondary | ICD-10-CM | POA: Diagnosis not present

## 2023-07-25 DIAGNOSIS — M79642 Pain in left hand: Secondary | ICD-10-CM | POA: Diagnosis not present

## 2023-07-25 DIAGNOSIS — M79641 Pain in right hand: Secondary | ICD-10-CM | POA: Diagnosis not present

## 2023-07-25 DIAGNOSIS — J0140 Acute pansinusitis, unspecified: Secondary | ICD-10-CM | POA: Diagnosis not present

## 2023-07-25 DIAGNOSIS — M19041 Primary osteoarthritis, right hand: Secondary | ICD-10-CM | POA: Diagnosis not present

## 2023-07-25 DIAGNOSIS — I10 Essential (primary) hypertension: Secondary | ICD-10-CM

## 2023-07-25 HISTORY — DX: Other forms of stomatitis: K12.1

## 2023-07-25 MED ORDER — METHYLPREDNISOLONE SODIUM SUCC 125 MG IJ SOLR
125.0000 mg | Freq: Once | INTRAMUSCULAR | Status: AC
Start: 2023-07-25 — End: 2023-07-25
  Administered 2023-07-25: 125 mg via INTRAMUSCULAR

## 2023-07-25 MED ORDER — AMOXICILLIN-POT CLAVULANATE 875-125 MG PO TABS
1.0000 | ORAL_TABLET | Freq: Two times a day (BID) | ORAL | 0 refills | Status: DC
Start: 2023-07-25 — End: 2024-01-22

## 2023-07-25 MED ORDER — MELOXICAM 15 MG PO TABS
15.0000 mg | ORAL_TABLET | Freq: Every day | ORAL | 1 refills | Status: DC
Start: 2023-07-25 — End: 2023-12-21

## 2023-07-25 MED ORDER — TRIAMCINOLONE ACETONIDE 0.1 % MT PSTE
1.0000 | PASTE | Freq: Two times a day (BID) | OROMUCOSAL | 1 refills | Status: AC
Start: 2023-07-25 — End: ?

## 2023-07-25 MED ORDER — LISINOPRIL-HYDROCHLOROTHIAZIDE 20-25 MG PO TABS: 1.0000 | ORAL_TABLET | Freq: Every day | ORAL | 1 refills | Status: DC

## 2023-07-25 NOTE — Progress Notes (Signed)
Established Patient Office Visit  Subjective   Patient ID: Debra Brown, female    DOB: [birth year]  Age: 67 y.o. MRN: [id number]  Chief Complaint  Patient presents with   Blood Pressure Check    HPI Pt is a 67 yo female with HTN, Hypothyroidism, hyperlipidemia, seasonal allergies who presents to the clinic for follow up.   She is taking all of her allergy medications: allegra, singulair, flonase, mucinex D. Hx of sinus infections. Symptoms for last 2 weeks. No fever, chills, nausea, vomiting, body aches.   She needs refills of medication. She did not take BP medications this morning. No CP, palpitations, headaches, vision changes.   She continues to have bilateral hand pain worse after using them     Active Ambulatory Problems    Diagnosis Date Noted   Hypothyroidism    Essential hypertension    HSV-1 (herpes simplex virus 1) infection    Allergic conjunctivitis 08/02/2012   External hemorrhoids 08/02/2012   Hemangioma 08/02/2012   Preventive measure 08/02/2012   History of uterine cancer 01/10/2020   Post-menopausal 01/10/2020   Osteopenia 01/22/2020   LBBB (left bundle branch block) 03/10/2019   Endometrial carcinoma (HCC) 02/28/2019   Elevated LDL cholesterol level 07/07/2020   Seborrheic keratoses 07/10/2020   PND (post-nasal drip) 07/13/2021   Sun-induced skin changes, keratosis 07/13/2021   Elevated hemoglobin (HCC) 07/14/2021   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    Objective:     BP 128/82   Pulse 62   Temp 98.4 F (36.9 C) (Oral)   Ht 5' 1.5" (1.562 m)   Wt 153 lb 1.3 oz (69.4 kg)   SpO2 98%    BMI 28.46 kg/m  BP Readings from Last 3 Encounters:  07/25/23 128/82  05/22/23 128/66  12/14/22 116/66   Wt Readings from Last 3 Encounters:  07/25/23 153 lb 1.3 oz (69.4 kg)  05/22/23 149 lb (67.6 kg)  12/14/22 149 lb 1.3 oz (67.6 kg)      Physical Exam Constitutional:      Appearance: Normal appearance. She is obese.  HENT:     Head: Normocephalic.     Comments: Tenderness over maxillary and frontal sinus to palpation.     Right Ear: Tympanic membrane, ear canal and external ear normal. There is no impacted cerumen.     Left Ear: Tympanic membrane, ear canal and external ear normal. There is no impacted cerumen.     Nose: Congestion and rhinorrhea present.     Mouth/Throat:     Mouth: Mucous membranes are moist.     Pharynx: Posterior oropharyngeal erythema present. No oropharyngeal exudate.     Comments: ulcer gumline upper palate PND with swollen uvula Eyes:     General:        Right eye: Discharge present.        Left eye: Discharge present.    Comments: Watery discharge with injected conjunctiva  Cardiovascular:     Rate and Rhythm: Normal rate and regular rhythm.  Pulmonary:     Effort: Pulmonary effort is normal.     Breath  sounds: Normal breath sounds.  Musculoskeletal:     Cervical back: Normal range of motion and neck supple. No tenderness.     Right lower leg: No edema.     Left lower leg: No edema.  Lymphadenopathy:     Cervical: No cervical adenopathy.  Neurological:     General: No focal deficit present.     Mental Status: She is alert and oriented to person, place, and time.  Psychiatric:        Mood and Affect: Mood normal.        The 10-year ASCVD risk score (Arnett DK, et al., 2019) is: 9.6%    Assessment & Plan:  .Marland KitchenBillye was seen today for blood pressure check.  Diagnoses and all orders for this visit:  Acute non-recurrent pansinusitis -     amoxicillin-clavulanate (AUGMENTIN) 875-125 MG tablet; Take 1 tablet by mouth 2 (two)  times daily. -     methylPREDNISolone sodium succinate (SOLU-MEDROL) 125 mg/2 mL injection 125 mg  Hypothyroidism, unspecified type -     TSH  Elevated hemoglobin (HCC) -     CBC w/Diff/Platelet  Essential hypertension -     CMP14+EGFR -     lisinopril-hydrochlorothiazide (ZESTORETIC) 20-25 MG tablet; Take 1 tablet by mouth daily.  Elevated LDL cholesterol level -     Lipid panel  Pain in both hands -     meloxicam (MOBIC) 15 MG tablet; Take 1 tablet (15 mg total) by mouth daily.  Primary osteoarthritis of both hands -     meloxicam (MOBIC) 15 MG tablet; Take 1 tablet (15 mg total) by mouth daily.  Mouth ulcer -     triamcinolone (KENALOG) 0.1 % paste; Use as directed 1 Application in the mouth or throat 2 (two) times daily. For mouth ulcer.   BP better on 2nd recheck CMP ordered Continue on same medications  Increased mobic 15mg   Discussed follow up with Dr. Karie Schwalbe for injection discussion Follow up as needed  TSH ordered to adjust medication accordingly  Ulcer in mouth Triamcinolone given to use over ulcer Keep dentures out as much as you can Salt water soaks Follow up as needed or if worsening  Sinusitis seems more allergic Solumedrol 125mg  IM done today Continue on allergy medications Augmentin if not improving in next day or so Follow up as needed    Return in about 6 months (around 01/22/2024), or if symptoms worsen or fail to improve.    Tandy Gaw, PA-C

## 2023-07-25 NOTE — Patient Instructions (Signed)

## 2023-07-26 ENCOUNTER — Encounter: Payer: Self-pay | Admitting: Physician Assistant

## 2023-07-26 ENCOUNTER — Other Ambulatory Visit: Payer: Self-pay

## 2023-07-26 DIAGNOSIS — E039 Hypothyroidism, unspecified: Secondary | ICD-10-CM

## 2023-07-26 LAB — LIPID PANEL
Chol/HDL Ratio: 2 ratio (ref 0.0–4.4)
Cholesterol, Total: 143 mg/dL (ref 100–199)
HDL: 72 mg/dL (ref >39)
LDL Chol Calc (NIH): 58 mg/dL (ref 0–99)
Triglycerides: 60 mg/dL (ref 0–149)
VLDL Cholesterol Cal: 13 mg/dL (ref 5–40)

## 2023-07-26 LAB — CMP14+EGFR
ALT: 19 IU/L (ref 0–32)
AST: 23 IU/L (ref 0–40)
Albumin: 4.3 g/dL (ref 3.9–4.9)
Alkaline Phosphatase: 102 IU/L (ref 44–121)
BUN/Creatinine Ratio: 14 (ref 12–28)
BUN: 13 mg/dL (ref 8–27)
Bilirubin Total: 0.5 mg/dL (ref 0.0–1.2)
CO2: 24 mmol/L (ref 20–29)
Calcium: 9.7 mg/dL (ref 8.7–10.3)
Chloride: 100 mmol/L (ref 96–106)
Creatinine, Ser: 0.91 mg/dL (ref 0.57–1.00)
Globulin, Total: 2.6 g/dL (ref 1.5–4.5)
Glucose: 88 mg/dL (ref 70–99)
Potassium: 4.2 mmol/L (ref 3.5–5.2)
Sodium: 139 mmol/L (ref 134–144)
Total Protein: 6.9 g/dL (ref 6.0–8.5)
eGFR: 68 mL/min/{1.73_m2} (ref >59)

## 2023-07-26 LAB — CBC WITH DIFFERENTIAL/PLATELET
Basophils Absolute: 0 10*3/uL (ref 0.0–0.2)
Basos: 1 %
EOS (ABSOLUTE): 0.3 10*3/uL (ref 0.0–0.4)
Eos: 5 %
Hematocrit: 47.6 % — ABNORMAL HIGH (ref 34.0–46.6)
Hemoglobin: 14.8 g/dL (ref 11.1–15.9)
Immature Grans (Abs): 0 10*3/uL (ref 0.0–0.1)
Immature Granulocytes: 0 %
Lymphocytes Absolute: 1.7 10*3/uL (ref 0.7–3.1)
Lymphs: 26 %
MCH: 25.7 pg — ABNORMAL LOW (ref 26.6–33.0)
MCHC: 31.1 g/dL — ABNORMAL LOW (ref 31.5–35.7)
MCV: 83 fL (ref 79–97)
Monocytes Absolute: 0.6 10*3/uL (ref 0.1–0.9)
Monocytes: 9 %
Neutrophils Absolute: 3.9 10*3/uL (ref 1.4–7.0)
Neutrophils: 59 %
Platelets: 268 10*3/uL (ref 150–450)
RBC: 5.76 x10E6/uL — ABNORMAL HIGH (ref 3.77–5.28)
RDW: 13.6 % (ref 11.7–15.4)
WBC: 6.6 10*3/uL (ref 3.4–10.8)

## 2023-07-26 LAB — TSH: TSH: 7.84 u[IU]/mL — ABNORMAL HIGH (ref 0.450–4.500)

## 2023-07-26 MED ORDER — LEVOTHYROXINE SODIUM 100 MCG PO TABS: 100.0000 ug | ORAL_TABLET | Freq: Every day | ORAL | 1 refills | Status: DC

## 2023-07-26 NOTE — Progress Notes (Signed)
Labs overall look GREAT except for your thyroid that shows out of range and more hypothyroid. We need to increase your synthroid to daily in morning before breakfast. Are you ok with this? Recheck TSH in 4-6 weeks.

## 2023-07-31 ENCOUNTER — Other Ambulatory Visit: Payer: Self-pay | Admitting: Physician Assistant

## 2023-07-31 DIAGNOSIS — R0982 Postnasal drip: Secondary | ICD-10-CM

## 2023-08-02 ENCOUNTER — Encounter: Payer: Self-pay | Admitting: Physician Assistant

## 2023-08-16 ENCOUNTER — Ambulatory Visit: Payer: Medicare HMO

## 2023-08-16 DIAGNOSIS — Z0000 Encounter for general adult medical examination without abnormal findings: Secondary | ICD-10-CM

## 2023-08-16 DIAGNOSIS — Z780 Asymptomatic menopausal state: Secondary | ICD-10-CM

## 2023-08-16 DIAGNOSIS — Z1231 Encounter for screening mammogram for malignant neoplasm of breast: Secondary | ICD-10-CM

## 2023-08-16 DIAGNOSIS — M85852 Other specified disorders of bone density and structure, left thigh: Secondary | ICD-10-CM | POA: Diagnosis not present

## 2023-08-16 NOTE — Progress Notes (Signed)
Vitamin D at least 1000 units and calcium at least 1300mg  or 4 servings of dairy.

## 2023-08-16 NOTE — Progress Notes (Signed)
Osteopenic(low bone mass) continue on vitamin D and calcium and continue to exercise regularly. We can check bone density again in 2 years.

## 2023-08-17 NOTE — Progress Notes (Signed)
Normal mammogram. Follow up in 1 year.

## 2023-09-18 ENCOUNTER — Other Ambulatory Visit: Payer: Self-pay | Admitting: Physician Assistant

## 2023-09-20 ENCOUNTER — Encounter: Payer: Self-pay | Admitting: Physician Assistant

## 2023-09-20 MED ORDER — LEVOTHYROXINE SODIUM 100 MCG PO TABS: 100.0000 ug | ORAL_TABLET | Freq: Every day | ORAL | 0 refills | Status: DC

## 2023-09-21 DIAGNOSIS — E039 Hypothyroidism, unspecified: Secondary | ICD-10-CM | POA: Diagnosis not present

## 2023-09-22 ENCOUNTER — Other Ambulatory Visit: Payer: Self-pay | Admitting: Physician Assistant

## 2023-09-22 LAB — TSH: TSH: 2.39 u[IU]/mL (ref 0.450–4.500)

## 2023-09-22 MED ORDER — LEVOTHYROXINE SODIUM 100 MCG PO TABS: 100.0000 ug | ORAL_TABLET | Freq: Every day | ORAL | 1 refills | Status: DC

## 2023-09-22 NOTE — Progress Notes (Signed)
Thyroid looks great. Synthroid refills sent!

## 2023-10-17 ENCOUNTER — Other Ambulatory Visit: Payer: Self-pay | Admitting: Physician Assistant

## 2023-11-07 DIAGNOSIS — H26493 Other secondary cataract, bilateral: Secondary | ICD-10-CM | POA: Diagnosis not present

## 2023-11-07 DIAGNOSIS — H04123 Dry eye syndrome of bilateral lacrimal glands: Secondary | ICD-10-CM | POA: Diagnosis not present

## 2023-11-07 DIAGNOSIS — Z961 Presence of intraocular lens: Secondary | ICD-10-CM | POA: Diagnosis not present

## 2023-11-07 DIAGNOSIS — H524 Presbyopia: Secondary | ICD-10-CM | POA: Diagnosis not present

## 2023-12-07 DIAGNOSIS — E0500 Thyrotoxicosis with diffuse goiter without thyrotoxic crisis or storm: Secondary | ICD-10-CM | POA: Diagnosis not present

## 2023-12-07 DIAGNOSIS — E890 Postprocedural hypothyroidism: Secondary | ICD-10-CM | POA: Diagnosis not present

## 2023-12-07 DIAGNOSIS — Z1330 Encounter for screening examination for mental health and behavioral disorders, unspecified: Secondary | ICD-10-CM | POA: Diagnosis not present

## 2023-12-14 DIAGNOSIS — C541 Malignant neoplasm of endometrium: Secondary | ICD-10-CM | POA: Diagnosis not present

## 2023-12-20 ENCOUNTER — Other Ambulatory Visit: Payer: Self-pay | Admitting: Physician Assistant

## 2023-12-20 DIAGNOSIS — M19041 Primary osteoarthritis, right hand: Secondary | ICD-10-CM

## 2023-12-20 DIAGNOSIS — M79642 Pain in left hand: Secondary | ICD-10-CM

## 2024-01-22 ENCOUNTER — Ambulatory Visit (INDEPENDENT_AMBULATORY_CARE_PROVIDER_SITE_OTHER): Payer: Medicare HMO | Admitting: Physician Assistant

## 2024-01-22 ENCOUNTER — Encounter: Payer: Self-pay | Admitting: Physician Assistant

## 2024-01-22 VITALS — BP 134/59 | HR 64 | Ht 61.5 in | Wt 160.2 lb

## 2024-01-22 DIAGNOSIS — I10 Essential (primary) hypertension: Secondary | ICD-10-CM | POA: Diagnosis not present

## 2024-01-22 DIAGNOSIS — E7800 Pure hypercholesterolemia, unspecified: Secondary | ICD-10-CM

## 2024-01-22 DIAGNOSIS — M79672 Pain in left foot: Secondary | ICD-10-CM | POA: Diagnosis not present

## 2024-01-22 DIAGNOSIS — I447 Left bundle-branch block, unspecified: Secondary | ICD-10-CM

## 2024-01-22 DIAGNOSIS — M25562 Pain in left knee: Secondary | ICD-10-CM

## 2024-01-22 DIAGNOSIS — E039 Hypothyroidism, unspecified: Secondary | ICD-10-CM

## 2024-01-22 NOTE — Patient Instructions (Addendum)
 Plantar Fasciitis: What to Know  Your plantar fascia is a band of thick tissue on the bottom of your foot. It connects your heel bone to the base of your toes. If the fascia gets irritated, it can cause pain in your heel or foot. This is called plantar fasciitis. In some cases, plantar fasciitis can make it hard for you to walk or move. The pain is often worse in the morning after sleeping, or after sitting or lying down for a long time. Pain may also be worse after walking or standing for a long time. What are the causes? Plantar fasciitis may be caused by: Standing for a long time. Wearing shoes that don't have good arch support. Doing high-impact activities. These are things that put stress on your joints. They include: Ballet. Aerobic exercises. These are exercises that make your heart beat faster. Being overweight. Having a way of walking, or gait, that isn't normal. Tight muscles in your calf, which is in the back of your lower leg. High arches in your feet, or flat feet. Starting a new sport or activity. What are the signs or symptoms? The main symptom of plantar fasciitis is heel pain. Your pain may get worse after: You take your first steps after a time of rest. This includes in the morning after you wake up, or after you've been sitting or lying down for a while. Standing still for a long time. Pain may lessen after 30-45 minutes of activity, such as gentle walking. How is this diagnosed? Plantar fasciitis may be diagnosed based on your medical history, your symptoms, and an exam. Your health care provider will check for: A tender spot on the bottom of your foot. A high arch in your foot, or flat feet. Pain when you move your foot. Trouble moving your foot. You may also have tests. These may include: X-rays. Ultrasound. MRI. How is this treated? Treatment depends on how bad your plantar fasciitis is. It may include: RICE therapy. This stands for rest, ice, pressure  (compression), and raising (elevating) the foot. Exercises to stretch your calves and plantar fascia. A night splint. This holds your foot in a stretched, upward position while you sleep. Physical therapy. This can help with symptoms. It can also prevent problems in the future. Shots of a steroid medicine called cortisone. This can help with pain and irritation. Extracorporeal shock wave therapy. This uses electric shocks to stimulate your plantar fascia. If other treatments don't help, you may need to have surgery. Follow these instructions at home: Managing pain, stiffness, and swelling  Use ice, an ice pack, or a frozen bottle of water as told. Place a towel between your skin and the ice. Roll the bottom of your foot over the ice or frozen bottle. Do this for 20 minutes, 2-3 times a day. If your skin turns red, take off the ice right away to prevent skin damage. The risk of damage is higher if you can't feel pain, heat, or cold. Wear shoes that have air-sole or gel-sole cushions. You could also try soft shoe inserts made for plantar fasciitis. Activity Try not to do things that cause pain. Ask what things are safe for you to do. Exercise as told. Try activities that are low impact. This means that they're easier on your joints. They include: Swimming. Water aerobics. Biking. General instructions Take your medicines only as told. Wear a night splint as told. Loosen the splint if your toes tingle, are numb, or turn cold and blue.  Stay at a healthy weight. Work with your provider to lose weight as needed. Contact a health care provider if: Your symptoms don't go away with treatment. You have pain that gets worse. Your pain makes it hard to move or do everyday things. This information is not intended to replace advice given to you by your health care provider. Make sure you discuss any questions you have with your health care provider. Document Revised: 04/10/2023 Document Reviewed:  04/10/2023 Elsevier Patient Education  2024 [employer]  Exercises for Plantar Fasciitis Foot and leg exercises can help if you have plantar fasciitis. Only do the exercises you were told to do. Make sure you know how to do the exercises safely. Follow the steps below. It's normal to feel mild discomfort. Stop if you feel pain or your pain gets worse. Do not start these exercises until told by your health care provider. Stretching and range-of-motion exercises These exercises warm up your muscles and joints. They also help with movement and flexibility of your foot. They can help with pain. Plantar fascia stretch This exercise will stretch your plantar fascia, which is a band of thick tissue on the bottom of your foot. Sit with your left / right leg crossed over your other knee. Hold your heel with one hand with that thumb near your arch. With your other hand, hold your toes. Gently pull your toes back toward the top of your foot. You should feel a stretch on the bottom of your toes, on the bottom of your foot, or both. Hold this stretch for __________ seconds. Slowly let go of your toes. Go back to the starting position. Repeat __________ times. Do this exercise __________ times a day. Gastroc stretch, standing This exercise is called an upper calf, or gastroc, stretch. It stretches the muscles in the back of your upper calf. Stand with your hands against a wall. Extend your left / right leg behind you. Bend your front knee just a little. Keep your heels on the floor, your toes facing forward, and your back knee straight. Shift your weight toward the wall. Do not arch your back. You should feel a gentle stretch in your upper calf. Hold this position for __________ seconds. Repeat __________ times. Do this exercise __________ times a day. Soleus stretch, standing This exercise is called a lower calf, or soleus, stretch. It stretches the muscles in the back of your lower calf. Stand with  your hands against a wall. Extend your left / right leg behind you, and bend your front knee slightly. Keep your heels on the floor and your toes facing forward. Bend your back knee and shift your weight slightly over your back leg. You should feel a gentle stretch deep in your lower calf. Hold this position for __________ seconds. Repeat __________ times. Do this exercise __________ times a day. Gastroc and soleus stretch, standing step This exercise stretches the muscles in the back of your lower leg. This includes your gastroc and soleus muscles. Stand with the ball of your left / right foot on the front of a step. The ball of your foot is on the walking surface, right under your toes. Keep your other foot firmly on the same step. Hold on to the wall or a railing for balance. Slowly lift your other foot, letting your body weight press your heel down over the edge of the front of the step. Keep your knee straight and unbent. You should feel a stretch in your calf. Hold this  position for __________ seconds. Return both feet to the step. Repeat this exercise with a slight bend in your left / right knee. Repeat __________ times with your left / right knee straight and __________ times with your left / right knee bent. Do this exercise __________ times a day. Balance exercise This exercise builds your balance and strength control of your arch. It helps take pressure off your plantar fascia. Single leg stand If this exercise is too easy, you can try it with your eyes closed or while standing on a pillow. Without shoes, stand near a railing or in a doorway. You may hold on to the railing or doorway as needed. Stand on your left / right foot. Keep your big toe down on the floor. Lift the arch of your foot. You should feel a stretch across the bottom of your foot and arch. Do not let your foot roll inward. Hold this position for __________ seconds. Repeat __________ times. Do this exercise __________  times a day. This information is not intended to replace advice given to you by your health care provider. Make sure you discuss any questions you have with your health care provider. Document Revised: 04/10/2023 Document Reviewed: 04/10/2023 Elsevier Patient Education  2024 [employer].

## 2024-01-22 NOTE — Progress Notes (Signed)
 Established Patient Office Visit  Subjective   Patient ID: Debra Brown, female    DOB: [birth year]  Age: 68 y.o. MRN: [id number]  CC: 6 month follow up   HPI Pt is a 68 yo female with HTN, Hypothyroidism, hyperlipidemia, seasonal allergies who presents to the clinic for follow up. She checks her blood pressure in the mornings - 130s/60s.   She states her allergies are ramping up and she continues to take all her medications.    She is also having left sided heel and knee pain for the past 3 weeks. When she walks and stands for a long time. The pain is relieved with rest. She endorses swelling. She has used ibuprofen when the pain is really bad and this does help her. She did by foot inserts for her shoes but this doesn't seem to help. She denies any trauma or injury to the heel or foot. Denies any joint stiffness.  .. Active Ambulatory Problems    Diagnosis Date Noted   Hypothyroidism    Essential hypertension    HSV-1 (herpes simplex virus 1) infection    Allergic conjunctivitis 08/02/2012   External hemorrhoids 08/02/2012   Hemangioma 08/02/2012   Preventive measure 08/02/2012   History of uterine cancer 01/10/2020   Post-menopausal 01/10/2020   Osteopenia 01/22/2020   LBBB (left bundle branch block) 03/10/2019   Endometrial carcinoma (HCC) 02/28/2019   Elevated LDL cholesterol level 07/07/2020   Seborrheic keratoses 07/10/2020   PND (post-nasal drip) 07/13/2021   Sun-induced skin changes, keratosis 07/13/2021   Elevated hemoglobin (HCC) 07/14/2021   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       Left knee pain Left heel pain      Objective:     .[name] Today's Vitals   01/22/24 1110  BP: (!) 134/59  Pulse: 64  SpO2: 99%  Weight: 72.7 kg  Height: 5' 1.5" (1.562 m)   Body mass index is 29.79 kg/m.   Physical Exam Constitutional:      Appearance: She is obese.  HENT:     Head: Normocephalic and atraumatic.  Eyes:     Extraocular Movements: Extraocular movements intact.  Cardiovascular:     Rate and Rhythm: Normal rate and regular rhythm.     Pulses: Normal pulses.     Heart sounds: Normal heart sounds.  Pulmonary:     Effort: Pulmonary effort is normal.     Breath sounds: Normal breath sounds.  Musculoskeletal:        General: Normal range of motion.     Comments: Left foot - tenderness to palpation of the dorsum of the foot  Skin:    General: Skin is warm.  Neurological:     Mental Status: She is alert.  Psychiatric:        Mood and Affect: Mood normal.        Behavior: Behavior normal.    Last CBC Lab Results  Component Value Date   WBC 6.6 07/25/2023   HGB 14.8 07/25/2023   HCT 47.6 (H) 07/25/2023  MCV 83 07/25/2023   MCH 25.7 (L) 07/25/2023   RDW 13.6 07/25/2023   PLT 268 07/25/2023   Last metabolic panel Lab Results  Component Value Date   GLUCOSE 88 07/25/2023   NA 139 07/25/2023   K 4.2 07/25/2023   CL 100 07/25/2023   CO2 24 07/25/2023   BUN 13 07/25/2023   CREATININE 0.91 07/25/2023   EGFR 68 07/25/2023   CALCIUM 9.7 07/25/2023   PROT 6.9 07/25/2023   ALBUMIN 4.3 07/25/2023   LABGLOB 2.6 07/25/2023   BILITOT 0.5 07/25/2023   ALKPHOS 102 07/25/2023   AST 23 07/25/2023   ALT 19 07/25/2023   Last lipids Lab Results  Component Value Date   CHOL 143 07/25/2023   HDL 72 07/25/2023   LDLCALC 58 07/25/2023   TRIG 60 07/25/2023   CHOLHDL 2.0 07/25/2023    Last thyroid functions Lab Results  Component Value Date   TSH 2.390 09/21/2023     The 10-year ASCVD risk score (Arnett DK, et al., 2019)  is: 10.4%    Assessment & Plan:  .Marland KitchenSkarlet was seen today for medical management of chronic issues.  Diagnoses and all orders for this visit:  Essential hypertension  Inflammatory heel pain, left  Acute pain of left knee    - COVID-19 vaccine due today, patient declines this today.  - Educated patient that her knee and heel pain is most likely due to plantar fascitis - Patient given plantar fascitis exercises, avoiding long walks.  - Continue Meloxicam daily - Recommended ice on her foot at night, and a plantar fascitis brace at night.  - Use Voltaren topical cream  - F/u in 2 weeks, may refer to podiatry.      Ilean China, Student-PA

## 2024-01-23 ENCOUNTER — Encounter: Payer: Self-pay | Admitting: Physician Assistant

## 2024-01-23 DIAGNOSIS — M79672 Pain in left foot: Secondary | ICD-10-CM

## 2024-01-23 LAB — CMP14+EGFR
ALT: 21 IU/L (ref 0–32)
AST: 27 IU/L (ref 0–40)
Albumin: 4.5 g/dL (ref 3.9–4.9)
Alkaline Phosphatase: 104 IU/L (ref 44–121)
BUN/Creatinine Ratio: 23 (ref 12–28)
BUN: 19 mg/dL (ref 8–27)
Bilirubin Total: 0.5 mg/dL (ref 0.0–1.2)
CO2: 21 mmol/L (ref 20–29)
Calcium: 9.7 mg/dL (ref 8.7–10.3)
Chloride: 94 mmol/L — ABNORMAL LOW (ref 96–106)
Creatinine, Ser: 0.82 mg/dL (ref 0.57–1.00)
Globulin, Total: 2.6 g/dL (ref 1.5–4.5)
Glucose: 78 mg/dL (ref 70–99)
Potassium: 4.2 mmol/L (ref 3.5–5.2)
Sodium: 135 mmol/L (ref 134–144)
Total Protein: 7.1 g/dL (ref 6.0–8.5)
eGFR: 77 mL/min/{1.73_m2} (ref >59)

## 2024-01-23 LAB — TSH+FREE T4
Free T4: 1.71 ng/dL (ref 0.82–1.77)
TSH: 3.38 u[IU]/mL (ref 0.450–4.500)

## 2024-01-23 MED ORDER — ATORVASTATIN CALCIUM 20 MG PO TABS: 20.0000 mg | ORAL_TABLET | Freq: Every day | ORAL | 1 refills | Status: DC

## 2024-01-23 MED ORDER — LISINOPRIL-HYDROCHLOROTHIAZIDE 20-25 MG PO TABS: 1.0000 | ORAL_TABLET | Freq: Every day | ORAL | 1 refills | Status: DC

## 2024-01-23 NOTE — Progress Notes (Signed)
 Laynie,   Thyroid looks great. Continue on same dose.  Kidney, liver, glucose look wonderful!

## 2024-02-21 ENCOUNTER — Ambulatory Visit

## 2024-02-21 DIAGNOSIS — M19072 Primary osteoarthritis, left ankle and foot: Secondary | ICD-10-CM | POA: Diagnosis not present

## 2024-02-21 DIAGNOSIS — M79672 Pain in left foot: Secondary | ICD-10-CM

## 2024-02-21 DIAGNOSIS — M7732 Calcaneal spur, left foot: Secondary | ICD-10-CM | POA: Diagnosis not present

## 2024-02-26 ENCOUNTER — Encounter: Payer: Self-pay | Admitting: Physician Assistant

## 2024-02-26 ENCOUNTER — Other Ambulatory Visit: Payer: Self-pay | Admitting: Physician Assistant

## 2024-02-26 DIAGNOSIS — M7732 Calcaneal spur, left foot: Secondary | ICD-10-CM | POA: Insufficient documentation

## 2024-02-26 DIAGNOSIS — M79672 Pain in left foot: Secondary | ICD-10-CM | POA: Insufficient documentation

## 2024-02-26 NOTE — Progress Notes (Signed)
 Left heel spur. Referral downstairs to podiatry made today.

## 2024-03-12 ENCOUNTER — Ambulatory Visit (INDEPENDENT_AMBULATORY_CARE_PROVIDER_SITE_OTHER): Payer: Medicare HMO

## 2024-03-12 VITALS — Ht 61.0 in | Wt 153.0 lb

## 2024-03-12 DIAGNOSIS — Z0000 Encounter for general adult medical examination without abnormal findings: Secondary | ICD-10-CM | POA: Diagnosis not present

## 2024-03-12 NOTE — Patient Instructions (Signed)
  Ms. Byington , Thank you for taking time to come for your Medicare Wellness Visit. I appreciate your ongoing commitment to your health goals. Please review the following plan we discussed and let me know if I can assist you in the future.   These are the goals we discussed:  Goals       Patient Stated (pt-stated)      06/16/2021 AWV Goal: Exercise for General Health  Patient will verbalize understanding of the benefits of increased physical activity: Exercising regularly is important. It will improve your overall fitness, flexibility, and endurance. Regular exercise also will improve your overall health. It can help you control your weight, reduce stress, and improve your bone density. Over the next year, patient will increase physical activity as tolerated with a goal of at least 150 minutes of moderate physical activity per week.  You can tell that you are exercising at a moderate intensity if your heart starts beating faster and you start breathing faster but can still hold a conversation. Moderate-intensity exercise ideas include: Walking 1 mile (1.6 km) in about 15 minutes Biking Hiking Golfing Dancing Water aerobics Patient will verbalize understanding of everyday activities that increase physical activity by providing examples like the following: Yard work, such as: Insurance underwriter Gardening Washing windows or floors Patient will be able to explain general safety guidelines for exercising:  Before you start a new exercise program, talk with your health care provider. Do not exercise so much that you hurt yourself, feel dizzy, or get very short of breath. Wear comfortable clothes and wear shoes with good support. Drink plenty of water while you exercise to prevent dehydration or heat stroke. Work out until your breathing and your heartbeat get faster.       Patient Stated (pt-stated)       Patient stated she would like to be more stronger and be more active.       Patient Stated      Patient states she would like to get stronger and lose about 5 lbs.         This is a list of the screening recommended for you and due dates:  Health Maintenance  Topic Date Due   COVID-19 Vaccine (3 - Pfizer risk series) 07/28/2020   Flu Shot  06/21/2024   Mammogram  08/15/2024   Medicare Annual Wellness Visit  03/12/2025   DTaP/Tdap/Td vaccine (3 - Td or Tdap) 07/26/2027   Colon Cancer Screening  05/06/2032   Pneumonia Vaccine  Completed   DEXA scan (bone density measurement)  Completed   Hepatitis C Screening  Completed   Zoster (Shingles) Vaccine  Completed   HPV Vaccine  Aged Out   Meningitis B Vaccine  Aged Out

## 2024-03-12 NOTE — Progress Notes (Signed)
 Subjective:   Debra Brown is a 68 y.o. female who presents for Medicare Annual (Subsequent) preventive examination.  Visit Complete: Virtual I connected with  Debra Brown on 03/12/24 by a audio enabled telemedicine application and verified that I am speaking with the correct person using two identifiers.  Patient Location: Home  Provider Location: Office/Clinic  I discussed the limitations of evaluation and management by telemedicine. The patient expressed understanding and agreed to proceed.  Vital Signs: Because this visit was a virtual/telehealth visit, some criteria may be missing or patient reported. Any vitals not documented were not able to be obtained and vitals that have been documented are patient reported.  Patient Medicare AWV questionnaire was completed by the patient on 03/08/2024; I have confirmed that all information answered by patient is correct and no changes since this date.  Cardiac Risk Factors include: advanced age (>59men, >75 women);hypertension;dyslipidemia     Objective:    Today's Vitals   03/12/24 1110  Weight: 153 lb (69.4 kg)  Height: 5\' 1"  (1.549 m)   Body mass index is 28.91 kg/m.     03/12/2024   11:19 AM 03/08/2023   11:02 AM 06/16/2021    9:01 AM 01/08/2020    2:39 PM  Advanced Directives  Does Patient Have a Medical Advance Directive? Yes Yes Yes Yes  Type of Estate agent of [place];Living will Living will Living will;Healthcare Power of [employer] Power of [place];Living will  Does patient want to make changes to medical advance directive? No - Patient declined No - Patient declined No - Patient declined   Copy of Healthcare Power of Attorney in Chart? No - copy requested  No - copy requested     Current Medications (verified) Outpatient Encounter Medications as of 03/12/2024  Medication Sig   Ascorbic Acid (VITAMIN C) 100 MG tablet Take 100 mg by mouth daily.   atorvastatin  (LIPITOR) 20 MG tablet  Take 1 tablet (20 mg total) by mouth daily.   CALCIUM  PO Take 600 mg by mouth daily.   cholecalciferol (VITAMIN D3) 25 MCG (1000 UNIT) tablet Take 2,000 Units by mouth daily.   Cyanocobalamin (VITAMIN B 12 PO) Take by mouth. Once a day   fexofenadine (ALLEGRA) 180 MG tablet Take 180 mg by mouth daily.   fluticasone  (FLONASE ) 50 MCG/ACT nasal spray SPRAY 2 SPRAYS INTO EACH NOSTRIL EVERY DAY   levothyroxine  (SYNTHROID ) 100 MCG tablet TAKE 1 TABLET BY MOUTH EVERY DAY   lisinopril -hydrochlorothiazide  (ZESTORETIC ) 20-25 MG tablet Take 1 tablet by mouth daily.   Magnesium 250 MG TABS Take by mouth daily.   meloxicam  (MOBIC ) 15 MG tablet TAKE 1 TABLET (15 MG TOTAL) BY MOUTH DAILY.   montelukast  (SINGULAIR ) 10 MG tablet TAKE 1 TABLET BY MOUTH EVERYDAY AT BEDTIME   Multiple Vitamins-Minerals (CENTRUM SILVER PO) Take by mouth daily.   Probiotic Product (PROBIOTIC-10 PO) Take by mouth daily.   Propylene Glycol (SYSTANE BALANCE OP) Apply to eye.   triamcinolone  (KENALOG ) 0.1 % paste Use as directed 1 Application in the mouth or throat 2 (two) times daily. For mouth ulcer.   TURMERIC CURCUMIN PO Take by mouth daily. 450/10 mg   Multiple Vitamins-Minerals (ZINC PO) Take 50 mg by mouth daily. (Patient not taking: Reported on 03/12/2024)   No facility-administered encounter medications on file as of 03/12/2024.    Allergies (verified) Patient has no known allergies.   History: Past Medical History:  Diagnosis Date   HSV-1 (herpes simplex virus 1) infection  Hyperlipidemia    Mouth ulcer 07/25/2023   Thyroid  disease    Uterine cancer (HCC)    Past Surgical History:  Procedure Laterality Date   ABDOMINAL HYSTERECTOMY     CESAREAN SECTION     TONSILLECTOMY AND ADENOIDECTOMY     TUBAL LIGATION     Family History  Problem Relation Age of Onset   Hyperlipidemia Mother    Hypertension Mother    Heart attack Mother    Diabetes Mother    Heart attack Father    Heart attack Sister    Diabetes  Sister    Heart attack Sister    Diabetes Sister    CAD Sister    Cancer Sister    Stroke Paternal Aunt    Social History   Socioeconomic History   Marital status: Married    Spouse name: Debra Brown   Number of children: 2   Years of education: 12   Highest education level: 12th grade  Occupational History   Occupation: Retired  Tobacco Use   Smoking status: Never   Smokeless tobacco: Never  Vaping Use   Vaping status: Never Used  Substance and Sexual Activity   Alcohol use: Yes    Comment: occassionally   Drug use: No   Sexual activity: Yes    Partners: Male  Other Topics Concern   Not on file  Social History Narrative   Lives with her husband along with her son and 2 grandchildren. She walks 3 miles everyday. She enjoys spending time with family and friends, cooking and walking.   Social Drivers of Corporate investment banker Strain: Low Risk  (01/18/2024)   Overall Financial Resource Strain (CARDIA)    Difficulty of Paying Living Expenses: Not hard at all  Food Insecurity: No Food Insecurity (03/12/2024)   Hunger Vital Sign    Worried About Running Out of Food in the Last Year: Never true    Ran Out of Food in the Last Year: Never true  Transportation Needs: No Transportation Needs (03/12/2024)   PRAPARE - Administrator, Civil Service (Medical): No    Lack of Transportation (Non-Medical): No  Physical Activity: Sufficiently Active (03/12/2024)   Exercise Vital Sign    Days of Exercise per Week: 5 days    Minutes of Exercise per Session: 30 min  Stress: No Stress Concern Present (03/12/2024)   [employer] of Occupational Health - Occupational Stress Questionnaire    Feeling of Stress : Not at all  Social Connections: Moderately Isolated (03/12/2024)   Social Connection and Isolation Panel [NHANES]    Frequency of Communication with Friends and Family: More than three times a week    Frequency of Social Gatherings with Friends and Family: More  than three times a week    Attends Religious Services: Never    Database administrator or Organizations: No    Attends Engineer, structural: Never    Marital Status: Married    Tobacco Counseling Counseling given: Not Answered   Clinical Intake:  Pre-visit preparation completed: Yes  Pain : No/denies pain     BMI - recorded: 28.91 Nutritional Status: BMI 25 -29 Overweight Nutritional Risks: None Diabetes: No  How often do you need to have someone help you when you read instructions, pamphlets, or other written materials from your doctor or pharmacy?: 1 - Never What is the last grade level you completed in school?: 12  Interpreter Needed?: No      Activities  of Daily Living    03/12/2024   11:11 AM 03/08/2024    5:31 PM  In your present state of health, do you have any difficulty performing the following activities:  Hearing? 0 0  Vision? 0 0  Difficulty concentrating or making decisions? 0 0  Walking or climbing stairs? 0 0  Dressing or bathing? 0 0  Doing errands, shopping? 0 0  Preparing Food and eating ? N N  Using the Toilet? N N  In the past six months, have you accidently leaked urine? N N  Do you have problems with loss of bowel control? N N  Managing your Medications? N N  Managing your Finances? N N  Housekeeping or managing your Housekeeping? N N    Patient Care Team: Breeback, Jade L, PA-C as PCP - General (Family Medicine) Henrietta Lofts, MD  Indicate any recent Medical Services you may have received from other than [facility] providers in the past year (date may be approximate).     Assessment:   This is a routine wellness examination for Tamryn.  Hearing/Vision screen No results found.   Goals Addressed             This Visit's Progress    Patient Stated       Patient states she would like to get stronger and lose about 5 lbs.       Depression Screen    03/12/2024   11:19 AM 07/25/2023    8:41 AM 03/08/2023   11:02 AM  07/13/2022    9:32 AM 01/14/2022    9:00 AM 07/13/2021    9:10 AM 07/13/2021    8:48 AM  PHQ 2/9 Scores  PHQ - 2 Score 0 0 0 0 0 0 0  PHQ- 9 Score    0  0     Fall Risk    03/12/2024   11:20 AM 03/08/2024    5:31 PM 07/25/2023    8:41 AM 03/08/2023   11:02 AM 03/06/2023    9:35 AM  Fall Risk   Falls in the past year? 0 0  0 0  Number falls in past yr: 0  0 0   Injury with Fall? 0  0 0   Risk for fall due to : No Fall Risks  No Fall Risks No Fall Risks   Follow up Falls evaluation completed  Falls evaluation completed Falls evaluation completed     MEDICARE RISK AT HOME: Medicare Risk at Home Any stairs in or around the home?: Yes If so, are there any without handrails?: No Home free of loose throw rugs in walkways, pet beds, electrical cords, etc?: No Adequate lighting in your home to reduce risk of falls?: Yes Life alert?: No Use of a cane, walker or w/c?: No Grab bars in the bathroom?: No Shower chair or bench in shower?: No Elevated toilet seat or a handicapped toilet?: No  TIMED UP AND GO:  Was the test performed?  No    Cognitive Function:        03/12/2024   11:20 AM 03/08/2023   11:06 AM 06/16/2021    9:13 AM  6CIT Screen  What Year? 0 points 0 points 0 points  What month? 0 points 0 points 0 points  What time? 0 points 0 points 0 points  Count back from 20 0 points 0 points 0 points  Months in reverse 0 points 0 points 0 points  Repeat phrase 0 points 0 points 0 points  Total Score 0 points 0 points 0 points    Immunizations Immunization History  Administered Date(s) Administered   Fluad Quad(high Dose 65+) 09/15/2021, 07/13/2022   Influenza Inj Mdck Quad Pf 09/15/2015   Influenza Split 08/02/2012, 11/26/2016   Influenza, High Dose Seasonal PF 09/05/2019, 08/14/2023   Influenza, Seasonal, Injecte, Preservative Fre 09/15/2015   Influenza,trivalent, recombinat, inj, PF 08/02/2012, 11/26/2016   Influenza-Unspecified 08/17/2017, 09/01/2018, 08/22/2019    PFIZER(Purple Top)SARS-COV-2 Vaccination 06/09/2020, 06/30/2020   Pneumococcal Conjugate-13 07/15/2019   Pneumococcal Polysaccharide-23 07/13/2021   Tdap 08/02/2012, 07/25/2017   Zoster Recombinant(Shingrix) 05/22/2023, 08/14/2023    TDAP status: Up to date  Flu Vaccine status: Up to date  Pneumococcal vaccine status: Up to date  Covid-19 vaccine status: Declined, Education has been provided regarding the importance of this vaccine but patient still declined. Advised may receive this vaccine at local pharmacy or Health Dept.or vaccine clinic. Aware to provide a copy of the vaccination record if obtained from local pharmacy or Health Dept. Verbalized acceptance and understanding.  Qualifies for Shingles Vaccine? Yes   Zostavax completed No   Shingrix Completed?: Yes  Screening Tests Health Maintenance  Topic Date Due   COVID-19 Vaccine (3 - Pfizer risk series) 07/28/2020   INFLUENZA VACCINE  06/21/2024   MAMMOGRAM  08/15/2024   Medicare Annual Wellness (AWV)  03/12/2025   DTaP/Tdap/Td (3 - Td or Tdap) 07/26/2027   Colonoscopy  05/06/2032   Pneumonia Vaccine 15+ Years old  Completed   DEXA SCAN  Completed   Hepatitis C Screening  Completed   Zoster Vaccines- Shingrix  Completed   HPV VACCINES  Aged Out   Meningococcal B Vaccine  Aged Out    Health Maintenance  Health Maintenance Due  Topic Date Due   COVID-19 Vaccine (3 - Pfizer risk series) 07/28/2020    Colorectal cancer screening: Type of screening: Colonoscopy. Completed 05/06/2022. Repeat every 10 years  Mammogram status: Completed 08/16/2023. Repeat every year  Bone Density status: Completed 08/16/2023. Results reflect: Bone density results: OSTEOPENIA. Repeat every 2 years.  Lung Cancer Screening: (Low Dose CT Chest recommended if Age 53-80 years, 20 pack-year currently smoking OR have quit w/in 15years.) does not qualify.   Lung Cancer Screening Referral: n/a  Additional Screening:  Hepatitis C Screening:  does qualify; Completed 07/06/2020  Vision Screening: Recommended annual ophthalmology exams for early detection of glaucoma and other disorders of the eye. Is the patient up to date with their annual eye exam?  Yes  Who is the provider or what is the name of the office in which the patient attends annual eye exams? [email or web address] If pt is not established with a provider, would they like to be referred to a provider to establish care?  N/a .   Dental Screening: Recommended annual dental exams for proper oral hygiene   Community Resource Referral / Chronic Care Management: CRR required this visit?  No   CCM required this visit?  No     Plan:     I have personally reviewed and noted the following in the patient's chart:   Medical and social history Use of alcohol, tobacco or illicit drugs  Current medications and supplements including opioid prescriptions. Patient is not currently taking opioid prescriptions. Functional ability and status Nutritional status Physical activity Advanced directives List of other physicians Hospitalizations, surgeries, and ER visits in previous 12 months. None Vitals Screenings to include cognitive, depression, and falls Referrals and appointments  In addition, I have reviewed and discussed with patient certain  preventive protocols, quality metrics, and best practice recommendations. A written personalized care plan for preventive services as well as general preventive health recommendations were provided to patient.     Aubrey Leaf, [place]   03/12/2024   After Visit Summary: (MyChart) Due to this being a telephonic visit, the after visit summary with patients personalized plan was offered to patient via MyChart   Nurse Notes:    Manasi Dishon is a 68 y.o. female patient of Araceli Knight, PA-C who had a [employer] Visit today via telephone. Amerika is Retired and lives with their family. She has 2 children. She reports that  she is socially active and does interact with friends/family regularly. She is moderately physically active and enjoys spending time with family and friends, cooking and walking.

## 2024-03-18 ENCOUNTER — Other Ambulatory Visit: Payer: Self-pay | Admitting: Physician Assistant

## 2024-03-18 DIAGNOSIS — R0982 Postnasal drip: Secondary | ICD-10-CM

## 2024-03-21 ENCOUNTER — Encounter: Payer: Self-pay | Admitting: Podiatry

## 2024-03-21 ENCOUNTER — Ambulatory Visit

## 2024-03-21 ENCOUNTER — Ambulatory Visit: Admitting: Podiatry

## 2024-03-21 ENCOUNTER — Other Ambulatory Visit: Payer: Self-pay

## 2024-03-21 DIAGNOSIS — M722 Plantar fascial fibromatosis: Secondary | ICD-10-CM

## 2024-03-21 DIAGNOSIS — M7732 Calcaneal spur, left foot: Secondary | ICD-10-CM | POA: Diagnosis not present

## 2024-03-21 DIAGNOSIS — M19072 Primary osteoarthritis, left ankle and foot: Secondary | ICD-10-CM | POA: Diagnosis not present

## 2024-03-21 DIAGNOSIS — M2042 Other hammer toe(s) (acquired), left foot: Secondary | ICD-10-CM | POA: Diagnosis not present

## 2024-03-21 MED ORDER — METHYLPREDNISOLONE 4 MG PO TBPK: ORAL_TABLET | ORAL | 0 refills | Status: DC

## 2024-03-21 NOTE — Patient Instructions (Signed)

## 2024-03-21 NOTE — Progress Notes (Signed)
  Subjective:  Patient ID: Debra Brown, female    DOB: January 17, 1956,   MRN: [id number]  No chief complaint on file.   68 y.o. female presents for concern of left heel pain that has been ongoing for a couple months. Relates first steps in the morning are very painful. She relates the bottom of her heel feels most of the pain. Relates she takes meloxicam  for hand arthritis but has also been taking ibuprofen twice a day. She has tried supportive shoes, rest and some stretching.   . Denies any other pedal complaints. Denies n/v/f/c.   Past Medical History:  Diagnosis Date   HSV-1 (herpes simplex virus 1) infection    Hyperlipidemia    Mouth ulcer 07/25/2023   Thyroid  disease    Uterine cancer (HCC)     Objective:  Physical Exam: Vascular: DP/PT pulses 2/4 bilateral. CFT <3 seconds. Normal hair growth on digits. No edema.  Skin. No lacerations or abrasions bilateral feet.  Musculoskeletal: MMT 5/5 bilateral lower extremities in DF, PF, Inversion and Eversion. Deceased ROM in DF of ankle joint. Tender to the medial calcaneal tubercle left . No pain with achilles, PT or arch. No pain with calcaneal squeeze.   Neurological: Sensation intact to light touch.   Assessment:   1. Plantar fasciitis of left foot      Plan:  Patient was evaluated and treated and all questions answered. Discussed plantar fasciitis with patient.  X-rays reviewed and discussed with patient. No acute fractures or dislocations noted. Mild spurring noted at inferior calcaneus.  Discussed treatment options including, ice, NSAIDS, supportive shoes, bracing, and stretching. Stretching exercises provided to be done on a daily basis.   Prescription for medrol  dose pack provided and sent to pharmacy. After this she can contrinue meloxicam  kidney function wnl and advised to stop the ibuprofen.  Follow-up 6 weeks or sooner if any problems arise. In the meantime, encouraged to call the office with any questions, concerns, change  in symptoms.     Jennefer Moats, DPM

## 2024-05-02 ENCOUNTER — Ambulatory Visit: Admitting: Podiatry

## 2024-05-02 ENCOUNTER — Encounter: Payer: Self-pay | Admitting: Podiatry

## 2024-05-02 DIAGNOSIS — M722 Plantar fascial fibromatosis: Secondary | ICD-10-CM

## 2024-05-02 NOTE — Progress Notes (Signed)
  Subjective:  Patient ID: Debra Brown, female    DOB: 1956-10-03,   MRN: [id number]  No chief complaint on file.   68 y.o. female presents for follow-up of left plantar fasciitis. Relates doing well over 50% better. Has been stretching and wearing brace which has really helped.   . Denies any other pedal complaints. Denies n/v/f/c.   Past Medical History:  Diagnosis Date   HSV-1 (herpes simplex virus 1) infection    Hyperlipidemia    Mouth ulcer 07/25/2023   Thyroid  disease    Uterine cancer (HCC)     Objective:  Physical Exam:  Vascular: DP/PT pulses 2/4 bilateral. CFT <3 seconds. Normal hair growth on digits. No edema.  Skin. No lacerations or abrasions bilateral feet.  Musculoskeletal: MMT 5/5 bilateral lower extremities in DF, PF, Inversion and Eversion. Deceased ROM in DF of ankle joint. Non tender to the medial calcaneal tubercle left . No pain with achilles, PT or arch. No pain with calcaneal squeeze.   Neurological: Sensation intact to light touch.   Assessment:   1. Plantar fasciitis of left foot       Plan:  Patient was evaluated and treated and all questions answered. Discussed plantar fasciitis with patient.  X-rays reviewed and discussed with patient. No acute fractures or dislocations noted. Mild spurring noted at inferior calcaneus.  Discussed treatment options including, ice, NSAIDS, supportive shoes, bracing, and stretching. Continue stretching and brace.  Follow-up as needed for pain.     Jennefer Moats, DPM

## 2024-06-05 DIAGNOSIS — E890 Postprocedural hypothyroidism: Secondary | ICD-10-CM | POA: Diagnosis not present

## 2024-06-13 ENCOUNTER — Other Ambulatory Visit: Payer: Self-pay | Admitting: Physician Assistant

## 2024-06-13 DIAGNOSIS — R0982 Postnasal drip: Secondary | ICD-10-CM

## 2024-06-14 ENCOUNTER — Other Ambulatory Visit: Payer: Self-pay | Admitting: Physician Assistant

## 2024-06-14 DIAGNOSIS — M79641 Pain in right hand: Secondary | ICD-10-CM

## 2024-06-14 DIAGNOSIS — M19041 Primary osteoarthritis, right hand: Secondary | ICD-10-CM

## 2024-07-04 ENCOUNTER — Other Ambulatory Visit: Payer: Self-pay | Admitting: Physician Assistant

## 2024-07-04 DIAGNOSIS — Z1231 Encounter for screening mammogram for malignant neoplasm of breast: Secondary | ICD-10-CM

## 2024-07-24 ENCOUNTER — Encounter: Payer: Self-pay | Admitting: Physician Assistant

## 2024-07-24 ENCOUNTER — Ambulatory Visit (INDEPENDENT_AMBULATORY_CARE_PROVIDER_SITE_OTHER): Admitting: Physician Assistant

## 2024-07-24 VITALS — BP 155/66 | HR 70 | Ht 61.0 in | Wt 153.0 lb

## 2024-07-24 DIAGNOSIS — I10 Essential (primary) hypertension: Secondary | ICD-10-CM

## 2024-07-24 DIAGNOSIS — E7800 Pure hypercholesterolemia, unspecified: Secondary | ICD-10-CM

## 2024-07-24 DIAGNOSIS — E039 Hypothyroidism, unspecified: Secondary | ICD-10-CM

## 2024-07-24 DIAGNOSIS — Z23 Encounter for immunization: Secondary | ICD-10-CM | POA: Diagnosis not present

## 2024-07-24 DIAGNOSIS — D582 Other hemoglobinopathies: Secondary | ICD-10-CM | POA: Diagnosis not present

## 2024-07-24 DIAGNOSIS — Z780 Asymptomatic menopausal state: Secondary | ICD-10-CM

## 2024-07-24 DIAGNOSIS — M8580 Other specified disorders of bone density and structure, unspecified site: Secondary | ICD-10-CM

## 2024-07-24 MED ORDER — LISINOPRIL-HYDROCHLOROTHIAZIDE 20-25 MG PO TABS
1.0000 | ORAL_TABLET | Freq: Every day | ORAL | 1 refills | Status: AC
Start: 2024-07-24 — End: ?

## 2024-07-24 NOTE — Progress Notes (Unsigned)
   Established Patient Office Visit  Subjective   Patient ID: Clydie Dillen, female    DOB: 1956/04/10  Age: 68 y.o. MRN: [id number]  Chief Complaint  Patient presents with  . Medical Management of Chronic Issues    HPI Plantar fasdc   {History (Optional):23778}  ROS    Objective:     There were no vitals taken for this visit. {Vitals History (Optional):23777}  Physical Exam   No results found for any visits on 07/24/24.  {Labs (Optional):23779}  The 10-year ASCVD risk score (Arnett DK, et al., 2019) is: 14.3%    Assessment & Plan:   Problem List Items Addressed This Visit       Unprioritized   Essential hypertension - Primary    No follow-ups on file.    Sudie Bandel, PA-C

## 2024-07-25 LAB — CMP14+EGFR
ALT: 19 IU/L (ref 0–32)
AST: 24 IU/L (ref 0–40)
Albumin: 4.3 g/dL (ref 3.9–4.9)
Alkaline Phosphatase: 101 IU/L (ref 44–121)
BUN/Creatinine Ratio: 17 (ref 12–28)
BUN: 15 mg/dL (ref 8–27)
Bilirubin Total: 0.6 mg/dL (ref 0.0–1.2)
CO2: 18 mmol/L — ABNORMAL LOW (ref 20–29)
Calcium: 9.3 mg/dL (ref 8.7–10.3)
Chloride: 96 mmol/L (ref 96–106)
Creatinine, Ser: 0.86 mg/dL (ref 0.57–1.00)
Globulin, Total: 2.4 g/dL (ref 1.5–4.5)
Glucose: 79 mg/dL (ref 70–99)
Potassium: 4.4 mmol/L (ref 3.5–5.2)
Sodium: 135 mmol/L (ref 134–144)
Total Protein: 6.7 g/dL (ref 6.0–8.5)
eGFR: 73 mL/min/1.73 (ref >59)

## 2024-07-25 LAB — CBC WITH DIFFERENTIAL/PLATELET
Basophils Absolute: 0 x10E3/uL (ref 0.0–0.2)
Basos: 1 %
EOS (ABSOLUTE): 0.3 x10E3/uL (ref 0.0–0.4)
Eos: 5 %
Hematocrit: 47.9 % — ABNORMAL HIGH (ref 34.0–46.6)
Hemoglobin: 15.5 g/dL (ref 11.1–15.9)
Immature Grans (Abs): 0 x10E3/uL (ref 0.0–0.1)
Immature Granulocytes: 0 %
Lymphocytes Absolute: 1.7 x10E3/uL (ref 0.7–3.1)
Lymphs: 26 %
MCH: 28.1 pg (ref 26.6–33.0)
MCHC: 32.4 g/dL (ref 31.5–35.7)
MCV: 87 fL (ref 79–97)
Monocytes Absolute: 0.5 x10E3/uL (ref 0.1–0.9)
Monocytes: 7 %
Neutrophils Absolute: 3.9 x10E3/uL (ref 1.4–7.0)
Neutrophils: 61 %
Platelets: 271 x10E3/uL (ref 150–450)
RBC: 5.51 x10E6/uL — ABNORMAL HIGH (ref 3.77–5.28)
RDW: 12.7 % (ref 11.7–15.4)
WBC: 6.4 x10E3/uL (ref 3.4–10.8)

## 2024-07-25 LAB — TSH+FREE T4
Free T4: 1.55 ng/dL (ref 0.82–1.77)
TSH: 4.73 u[IU]/mL — ABNORMAL HIGH (ref 0.450–4.500)

## 2024-07-25 LAB — VITAMIN D 25 HYDROXY (VIT D DEFICIENCY, FRACTURES): Vit D, 25-Hydroxy: 89.7 ng/mL (ref 30.0–100.0)

## 2024-07-25 LAB — LIPID PANEL
Chol/HDL Ratio: 2.1 ratio (ref 0.0–4.4)
Cholesterol, Total: 148 mg/dL (ref 100–199)
HDL: 72 mg/dL (ref >39)
LDL Chol Calc (NIH): 64 mg/dL (ref 0–99)
Triglycerides: 55 mg/dL (ref 0–149)
VLDL Cholesterol Cal: 12 mg/dL (ref 5–40)

## 2024-07-26 ENCOUNTER — Encounter: Payer: Self-pay | Admitting: Physician Assistant

## 2024-07-26 ENCOUNTER — Ambulatory Visit: Payer: Self-pay | Admitting: Physician Assistant

## 2024-07-26 MED ORDER — LEVOTHYROXINE SODIUM 100 MCG PO TABS: 100.0000 ug | ORAL_TABLET | Freq: Every day | ORAL | 3 refills | Status: AC

## 2024-07-26 NOTE — Progress Notes (Signed)
 [name],   TSH a little elevated but free T4 is perfect. Stay at same dose.  Cholesterol looks GREAT.  Vitamin d  is amazing!  Kidney, liver, glucose look great.

## 2024-08-22 ENCOUNTER — Ambulatory Visit

## 2024-08-22 DIAGNOSIS — Z1231 Encounter for screening mammogram for malignant neoplasm of breast: Secondary | ICD-10-CM | POA: Diagnosis not present

## 2024-08-26 ENCOUNTER — Ambulatory Visit: Payer: Self-pay | Admitting: Physician Assistant

## 2024-08-26 NOTE — Progress Notes (Signed)
 Normal mammogram. Follow up in 1 year.

## 2024-09-03 ENCOUNTER — Ambulatory Visit: Admitting: Physician Assistant

## 2024-09-03 VITALS — BP 146/62 | HR 62 | Ht 61.0 in | Wt 156.0 lb

## 2024-09-03 DIAGNOSIS — M7061 Trochanteric bursitis, right hip: Secondary | ICD-10-CM | POA: Diagnosis not present

## 2024-09-03 MED ORDER — METHYLPREDNISOLONE ACETATE 40 MG/ML IJ SUSP
40.0000 mg | Freq: Once | INTRAMUSCULAR | Status: AC
  Administered 2024-09-03: 40 mg via INTRAMUSCULAR

## 2024-09-03 NOTE — Patient Instructions (Signed)
 Hip Bursitis Rehab Ask your health care provider which exercises are safe for you. Do exercises exactly as told by your health care provider and adjust them as directed. It is normal to feel mild stretching, pulling, tightness, or discomfort as you do these exercises. Stop right away if you feel sudden pain or your pain gets worse. Do not begin these exercises until told by your health care provider. Stretching exercise This exercise warms up your muscles and joints and improves the movement and flexibility of your hip. This exercise also helps to relieve pain and stiffness. Iliotibial band stretch An iliotibial band is a strong band of muscle tissue that runs from the outer side of your hip to the outer side of your thigh and knee. Lie on your side with your left / right leg in the top position. Bend your left / right knee and grab your ankle. Stretch out your bottom arm to help you balance. Slowly bring your knee back so your thigh is slightly behind your body. Slowly lower your knee toward the floor until you feel a gentle stretch on the outside of your left / right thigh. If you do not feel a stretch and your knee will not lower more toward the floor, place the heel of your other foot on top of your knee and pull your knee down toward the floor with your foot. Hold this position for __________ seconds. Slowly return to the starting position. Repeat __________ times. Complete this exercise __________ times a day. Strengthening exercises These exercises build strength and endurance in your hip and pelvis. Endurance is the ability to use your muscles for a long time, even after they get tired. Bridge This exercise strengthens the muscles that move your thigh backward (hip extensors). Lie on your back on a firm surface with your knees bent and your feet flat on the floor. Tighten your buttocks muscles and lift your buttocks off the floor until your trunk is level with your thighs. Do not arch your  back. You should feel the muscles working in your buttocks and the back of your thighs. If you do not feel these muscles, slide your feet 1-2 inches (2.5-5 cm) farther away from your buttocks. If this exercise is too easy, try doing it with your arms crossed over your chest. Hold this position for __________ seconds. Slowly lower your hips to the starting position. Let your muscles relax completely after each repetition. Repeat __________ times. Complete this exercise __________ times a day. Squats This exercise strengthens the muscles in front of your thigh and knee (quadriceps). Stand in front of a table, with your feet and knees pointing straight ahead. You may rest your hands on the table for balance but not for support. Slowly bend your knees and lower your hips like you are going to sit in a chair. Keep your weight over your heels, not over your toes. Keep your lower legs upright so they are parallel with the table legs. Do not let your hips go lower than your knees. Do not bend lower than told by your health care provider. If your hip pain increases, do not bend as low. Hold the squat position for __________ seconds. Slowly push with your legs to return to standing. Do not use your hands to pull yourself to standing. Repeat __________ times. Complete this exercise __________ times a day. Hip hike  Stand sideways on a bottom step. Stand on your left / right leg with your other foot unsupported next to  the step. You can hold on to the railing or wall for balance if needed. Keep your knees straight and your torso square. Then lift your left / right hip up toward the ceiling. Hold this position for __________ seconds. Slowly let your left / right hip lower toward the floor, past the starting position. Your foot should get closer to the floor. Do not lean or bend your knees. Repeat __________ times. Complete this exercise __________ times a day. Single leg stand This exercise increases  your balance. Without shoes, stand near a railing or in a doorway. You may hold on to the railing or door frame as needed for balance. Squeeze your left / right buttock muscles, then lift up your other foot. Do not let your left / right hip push out to the side. It is helpful to stand in front of a mirror for this exercise so you can watch your hip. Hold this position for __________ seconds. Repeat __________ times. Complete this exercise __________ times a day. This information is not intended to replace advice given to you by your health care provider. Make sure you discuss any questions you have with your health care provider. Document Revised: 10/20/2021 Document Reviewed: 10/20/2021 Elsevier Patient Education  2024 [employer].

## 2024-09-03 NOTE — Progress Notes (Unsigned)
   Acute Office Visit  Subjective:     Patient ID: Debra Brown, female    DOB: 01-23-1956, 68 y.o.   MRN: [id number]  Chief Complaint  Patient presents with   Medical Management of Chronic Issues    Patient is a 67 yo female presenting today for pain of the right hip. She states it started 3 weeks ago after taking a walk with her daughter. The pain is described as achy and dull, but can become intense from long periods of bearing weight on her left leg and laying on her right side. She attempted to use a heating pad with some relief and takes Meloxicam  daily for arthritis with no noticeable changes. She states the pain radiates down the back of her leg and into her groin area. Pt denies redness or gait disturbances noted. No other associated symptoms at this time.   Review of Systems  Musculoskeletal:  Positive for joint pain. Negative for falls and myalgias.  All other systems reviewed and are negative.       Objective:    BP (!) 146/62   Pulse 62   Ht 5' 1 (1.549 m)   Wt 70.8 kg   SpO2 99%   BMI 29.48 kg/m  {Vitals History (Optional):23777}  Physical Exam Constitutional:      Appearance: Normal appearance. She is normal weight.  HENT:     Head: Normocephalic and atraumatic.  Cardiovascular:     Rate and Rhythm: Normal rate and regular rhythm.     Heart sounds: Normal heart sounds.  Pulmonary:     Effort: Pulmonary effort is normal.     Breath sounds: Normal breath sounds.  Musculoskeletal:     Right hip: Tenderness present. Normal range of motion. Normal strength.     Left hip: Normal.     Right upper leg: Swelling present. No edema.     Left upper leg: Normal.  Neurological:     General: No focal deficit present.     Mental Status: She is alert and oriented to person, place, and time.  Psychiatric:        Mood and Affect: Mood normal.        Behavior: Behavior normal.        Thought Content: Thought content normal.        Judgment: Judgment normal.      No results found for any visits on 09/03/24.      Assessment & Plan:   Problem List Items Addressed This Visit       Musculoskeletal and Integument   Greater trochanteric bursitis of right hip - Primary    Meds ordered this encounter  Medications   methylPREDNISolone  acetate (DEPO-MEDROL ) injection 40 mg    No follow-ups on file.  Destine K Riggins, Student-PA

## 2024-09-04 ENCOUNTER — Encounter: Payer: Self-pay | Admitting: Physician Assistant

## 2024-09-08 ENCOUNTER — Other Ambulatory Visit: Payer: Self-pay | Admitting: Physician Assistant

## 2024-09-08 DIAGNOSIS — E7800 Pure hypercholesterolemia, unspecified: Secondary | ICD-10-CM

## 2024-10-30 ENCOUNTER — Telehealth: Admitting: Physician Assistant

## 2024-10-30 ENCOUNTER — Encounter: Payer: Self-pay | Admitting: Physician Assistant

## 2024-10-30 DIAGNOSIS — J0140 Acute pansinusitis, unspecified: Secondary | ICD-10-CM

## 2024-10-30 DIAGNOSIS — J3489 Other specified disorders of nose and nasal sinuses: Secondary | ICD-10-CM | POA: Diagnosis not present

## 2024-10-30 MED ORDER — MUPIROCIN 2 % EX OINT: TOPICAL_OINTMENT | CUTANEOUS | 0 refills | Status: AC

## 2024-10-30 MED ORDER — AMOXICILLIN-POT CLAVULANATE 875-125 MG PO TABS: 1.0000 | ORAL_TABLET | Freq: Two times a day (BID) | ORAL | 0 refills | Status: AC

## 2024-10-30 NOTE — Patient Instructions (Signed)

## 2024-10-30 NOTE — Progress Notes (Signed)
..  Virtual Visit via Video Note  I connected with Debra Brown on 10/30/24 at  1:00 PM EST by a video enabled telemedicine application and verified that I am speaking with the correct person using two identifiers.  Location: Patient: home Provider: clinic  .SABRAParticipating in visit:  Patient: Debra Brown Provider: Vermell Bologna PA-c   I discussed the limitations of evaluation and management by telemedicine and the availability of in person appointments. The patient expressed understanding and agreed to proceed.  History of Present Illness: SABRASABRADiscussed the use of AI scribe software for clinical note transcription with the patient, who gave verbal consent to proceed.  History of Present Illness Debra Brown is a 68 year old female who presents with persistent sinus issues and nasal sores.  Sinonasal symptoms - Sinus congestion present for three weeks - No fever, chills, SOB - Ear pain associated with sinus symptoms - Nasal sores described as 'a mess', with nose split and not healing for three weeks - She is blowing a lot out and coughing some stuff up  Symptom management - Over-the-counter medications including Tylenol cold sinus severe have been ineffective - Vaseline applied around nose for irritation with limited relief  Current medications - Daily Flonase  use - Nightly allergy pill   Observations/Objective: No acute distress Normal mood  Normal breathing   Assessment and Plan: SABRASABRADiagnoses and all orders for this visit:  Acute non-recurrent pansinusitis -     amoxicillin -clavulanate (AUGMENTIN ) 875-125 MG tablet; Take 1 tablet by mouth 2 (two) times daily.  Nasal vestibulitis -     mupirocin ointment (BACTROBAN) 2 %; Apply to affected area in nose TID for 7 days.   Assessment & Plan Acute pansinusitis Symptoms persisted for three weeks with ineffective over-the-counter treatments. Nasal sores irritated and unhealed. - Prescribed Augmentin  for 10 days.  -  Recommended over-the-counter nasal Ayr gel. - Advised topical moisturizer or Vaseline for nasal irritation. - Prescribed topical antibiotic for nasal application three times daily for 5-7 days as needed.  - Follow up if not improving or symptoms worsening.      Follow Up Instructions:    I discussed the assessment and treatment plan with the patient. The patient was provided an opportunity to ask questions and all were answered. The patient agreed with the plan and demonstrated an understanding of the instructions.   The patient was advised to call back or seek an in-person evaluation if the symptoms worsen or if the condition fails to improve as anticipated.   Shannie Kontos, PA-C

## 2024-10-31 NOTE — Progress Notes (Signed)
 Debra Brown                                          MRN: [id number]   10/31/2024   The [facility] Quality Team Specialist reviewed this patient medical record for the purposes of chart review for care gap closure. The following were reviewed: chart review for care gap closure-controlling blood pressure.    [facility] Quality Team

## 2024-12-08 ENCOUNTER — Other Ambulatory Visit: Payer: Self-pay | Admitting: Physician Assistant

## 2024-12-08 DIAGNOSIS — M19041 Primary osteoarthritis, right hand: Secondary | ICD-10-CM

## 2024-12-08 DIAGNOSIS — M79641 Pain in right hand: Secondary | ICD-10-CM

## 2025-01-21 ENCOUNTER — Ambulatory Visit: Admitting: Physician Assistant

## 2025-03-13 ENCOUNTER — Ambulatory Visit
